# Patient Record
Sex: Female | Born: 1950 | Race: White | Hispanic: No | Marital: Married | Smoking: Former smoker
Health system: Southern US, Community
[De-identification: ages and names within clinical notes are randomized; demographics above are authoritative.]

## PROBLEM LIST (undated history)

## (undated) DIAGNOSIS — Z8719 Personal history of other diseases of the digestive system: Secondary | ICD-10-CM

## (undated) DIAGNOSIS — R296 Repeated falls: Secondary | ICD-10-CM

## (undated) DIAGNOSIS — H409 Unspecified glaucoma: Secondary | ICD-10-CM

## (undated) DIAGNOSIS — G2581 Restless legs syndrome: Secondary | ICD-10-CM

## (undated) DIAGNOSIS — M545 Low back pain, unspecified: Secondary | ICD-10-CM

## (undated) DIAGNOSIS — W19XXXA Unspecified fall, initial encounter: Secondary | ICD-10-CM

## (undated) DIAGNOSIS — K589 Irritable bowel syndrome without diarrhea: Secondary | ICD-10-CM

## (undated) HISTORY — DX: Repeated falls: R29.6

## (undated) HISTORY — DX: Restless legs syndrome: G25.81

## (undated) HISTORY — PX: BREAST EXCISIONAL BIOPSY: SUR124

## (undated) HISTORY — PX: COLONOSCOPY W/ POLYPECTOMY: SHX1380

## (undated) HISTORY — DX: Unspecified glaucoma: H40.9

## (undated) HISTORY — PX: HAMMER TOE SURGERY: SHX385

## (undated) HISTORY — DX: Unspecified fall, initial encounter: W19.XXXA

## (undated) HISTORY — DX: Low back pain, unspecified: M54.50

## (undated) HISTORY — PX: BREAST SURGERY: SHX581

---

## 2012-11-29 ENCOUNTER — Emergency Department (HOSPITAL_COMMUNITY)
Admission: EM | Admit: 2012-11-29 | Discharge: 2012-11-30 | Disposition: A | Discharge status: Home / Self Care | Payer: Managed Care, Other (non HMO) | Attending: Emergency Medicine | Admitting: Emergency Medicine

## 2012-11-29 ENCOUNTER — Encounter (HOSPITAL_COMMUNITY): Payer: Self-pay | Admitting: *Deleted

## 2012-11-29 ENCOUNTER — Emergency Department (HOSPITAL_COMMUNITY): Payer: Managed Care, Other (non HMO)

## 2012-11-29 DIAGNOSIS — Y9301 Activity, walking, marching and hiking: Secondary | ICD-10-CM | POA: Insufficient documentation

## 2012-11-29 DIAGNOSIS — W010XXA Fall on same level from slipping, tripping and stumbling without subsequent striking against object, initial encounter: Secondary | ICD-10-CM | POA: Insufficient documentation

## 2012-11-29 DIAGNOSIS — Z880 Allergy status to penicillin: Secondary | ICD-10-CM | POA: Insufficient documentation

## 2012-11-29 DIAGNOSIS — Y929 Unspecified place or not applicable: Secondary | ICD-10-CM | POA: Insufficient documentation

## 2012-11-29 DIAGNOSIS — S42309A Unspecified fracture of shaft of humerus, unspecified arm, initial encounter for closed fracture: Secondary | ICD-10-CM | POA: Insufficient documentation

## 2012-11-29 DIAGNOSIS — W1809XA Striking against other object with subsequent fall, initial encounter: Secondary | ICD-10-CM | POA: Insufficient documentation

## 2012-11-29 DIAGNOSIS — S42302A Unspecified fracture of shaft of humerus, left arm, initial encounter for closed fracture: Secondary | ICD-10-CM

## 2012-11-29 MED ORDER — MORPHINE SULFATE ER 15 MG PO TBCR: 15.0000 mg | EXTENDED_RELEASE_TABLET | Freq: Two times a day (BID) | ORAL | Status: DC

## 2012-11-29 MED ORDER — MORPHINE SULFATE 4 MG/ML IJ SOLN
1.0000 mg | Freq: Once | INTRAMUSCULAR | Status: AC
  Administered 2012-11-29: 1 mg via INTRAVENOUS
  Filled 2012-11-29: qty 1

## 2012-11-29 MED ORDER — CYCLOBENZAPRINE HCL 10 MG PO TABS: 10.0000 mg | ORAL_TABLET | Freq: Two times a day (BID) | ORAL | Status: DC | PRN

## 2012-11-29 MED ORDER — FENTANYL CITRATE 0.05 MG/ML IJ SOLN
50.0000 ug | Freq: Once | INTRAMUSCULAR | Status: AC
  Administered 2012-11-29: 50 ug via INTRAVENOUS
  Filled 2012-11-29: qty 2

## 2012-11-29 NOTE — ED Notes (Signed)
Pt states was celebrating selling her house in [place], drank 2 beers, slipped on a rug and fell on L arm. Pt states pain 4/10 at this time.

## 2012-11-29 NOTE — ED Notes (Signed)
Splint and sling placed on L arm by Ortho Tech, ambulated to restroom w/o difficulty.

## 2012-11-29 NOTE — ED Notes (Signed)
Patient transported to X-ray 

## 2012-11-29 NOTE — ED Notes (Signed)
ZOX:[id number]<AV> Expected date:11/29/12<BR> Expected time: 8:28 PM<BR> Means of arrival:Ambulance<BR> Comments:<BR> Arm injury

## 2012-11-29 NOTE — ED Provider Notes (Signed)
History     CSN: [id number]  Arrival date & time 11/29/12  2038   First MD Initiated Contact with Patient 11/29/12 2049      Chief Complaint  Patient presents with  . Arm Injury    (Consider location/radiation/quality/duration/timing/severity/associated sxs/prior treatment) HPI  Patient is a 62 year old female presenting via EMS for left arm pain. Patient states she was walking and talking on the phone when she slipped and fell hitting her left arm. Denies any cracking or other adventitious sounds when she fell. The patient was unable to move her arm after the incident. She states she is able to move her wrist forearm and fingers. Pt states she did have 4-5 beers this evening while she and her husband were celebrating selling their house.  Denies any numbness or tingling or radiating pain down the left arm. Denies loss of consciousness or hitting her head.  History reviewed. No pertinent past medical history.  History reviewed. No pertinent past surgical history.  History reviewed. No pertinent family history.  History  Substance Use Topics  . Smoking status: Never Smoker   . Smokeless tobacco: Never Used  . Alcohol Use: Yes    OB History   Grav Para Term Preterm Abortions TAB SAB Ect Mult Living                  Review of Systems  Constitutional: Negative.   HENT: Negative.   Eyes: Negative.   Respiratory: Negative.   Cardiovascular: Negative.   Gastrointestinal: Negative.   Genitourinary: Negative.   Musculoskeletal:       Left arm pain  Skin: Negative.   Neurological: Negative.     Allergies  Codeine and Penicillins  Home Medications   Current Outpatient Rx  Name  Route  Sig  Dispense  Refill  . cyclobenzaprine (FLEXERIL) 10 MG tablet   Oral   Take 1 tablet (10 mg total) by mouth 2 (two) times daily as needed for muscle spasms.   20 tablet   0   . morphine (MS CONTIN) 15 MG 12 hr tablet   Oral   Take 1 tablet (15 mg total) by mouth 2 (two) times  daily.   20 tablet   0     BP 101/61  Pulse 77  Temp(Src) 98.1 F (36.7 C) (Oral)  Resp 16  SpO2 100%  Physical Exam  Constitutional: She is oriented to person, place, and time. She appears well-developed and well-nourished.  HENT:  Head: Normocephalic and atraumatic.  Eyes: EOM are normal. Pupils are equal, round, and reactive to light.  Cardiovascular: Normal rate, regular rhythm and normal heart sounds.   Pulses:      Radial pulses are 2+ on the right side, and 2+ on the left side.  Pulmonary/Chest: Effort normal and breath sounds normal.  Abdominal: Soft. Bowel sounds are normal.  Musculoskeletal:       Right upper arm: Normal.       Left upper arm: She exhibits tenderness, swelling and deformity. She exhibits no edema.       Left hand: Normal. She exhibits normal two-point discrimination and normal capillary refill. Normal sensation noted. Normal strength noted.  No open wound.  No neurovascular compromise. No wrist drop  Neurological: She is alert and oriented to person, place, and time.  Skin: Skin is warm and dry.  Psychiatric: She has a normal mood and affect.    ED Course  Procedures (including critical care time)  Patient able to ambulate in  the ED w/o difficulty.   Labs Reviewed - No data to display Dg Humerus Left  11/29/2012  *RADIOLOGY REPORT*  Clinical Data: Fall, arm pain.  LEFT HUMERUS - 2+ VIEW  Comparison: None  Findings: There is an oblique displaced and angulated fracture through the midshaft of the left humerus.  Overlapping fracture fragments.  No subluxation or dislocation in the left shoulder or elbow.  IMPRESSION: Displaced, angulated midshaft left humeral fracture.   Original Report Authenticated By: Charlett Nose, M.D.      1. Humerus fracture, left, closed, initial encounter       MDM  Pt presents after falling on left arm. Obvious deformity w/ swelling noted in left arm. No neurovascular compromise or wrist drop noted on PE. X-ray  confirmed left humeral fracture. Dr. Charlann Boxer was consulted and recommended splinting with sling and a follow up appointment with him in 24-48 hours. Pt agreeable to plan. Given pain medications and instructions for splint and sling. Patient d/w with Dr. Radford Pax, agrees with plan. Patient is stable at time of discharge          Jeannetta Ellis, PA-C 12/01/12 1315

## 2012-11-29 NOTE — ED Notes (Signed)
Per EMS pt was walking and talking on phone, slipped and fell on L arm, didn't hear or feel crack, when got up realized arm was not moving right, called EMS, brake is upper L arm, obvious deformity, denies pain, pt did have 4-5 beers this evening, L arm immobilized by EMS, 20G R hand, BP 110/76, HR 60.

## 2012-12-02 NOTE — ED Provider Notes (Signed)
Medical screening examination/treatment/procedure(s) were conducted as a shared visit with non-physician practitioner(s) and myself.  I personally evaluated the patient during the encounter   Nelia Shi, MD 12/02/12 2310

## 2012-12-15 ENCOUNTER — Encounter (HOSPITAL_COMMUNITY): Payer: Self-pay | Admitting: *Deleted

## 2012-12-15 ENCOUNTER — Encounter (HOSPITAL_COMMUNITY): Payer: Self-pay | Admitting: Pharmacy Technician

## 2012-12-16 ENCOUNTER — Observation Stay (HOSPITAL_COMMUNITY)
Admission: RE | Admit: 2012-12-16 | Discharge: 2012-12-18 | Disposition: A | Discharge status: Home / Self Care | Payer: Managed Care, Other (non HMO) | Source: Ambulatory Visit | Attending: Orthopedic Surgery | Admitting: Orthopedic Surgery

## 2012-12-16 ENCOUNTER — Ambulatory Visit (HOSPITAL_COMMUNITY): Payer: Managed Care, Other (non HMO) | Admitting: Anesthesiology

## 2012-12-16 ENCOUNTER — Encounter (HOSPITAL_COMMUNITY)
Admission: RE | Disposition: A | Discharge status: Home / Self Care | Payer: Self-pay | Source: Ambulatory Visit | Attending: Orthopedic Surgery

## 2012-12-16 ENCOUNTER — Encounter (HOSPITAL_COMMUNITY): Payer: Self-pay | Admitting: *Deleted

## 2012-12-16 ENCOUNTER — Ambulatory Visit (HOSPITAL_COMMUNITY): Payer: Managed Care, Other (non HMO)

## 2012-12-16 ENCOUNTER — Encounter (HOSPITAL_COMMUNITY): Payer: Self-pay | Admitting: Anesthesiology

## 2012-12-16 DIAGNOSIS — W19XXXA Unspecified fall, initial encounter: Secondary | ICD-10-CM | POA: Insufficient documentation

## 2012-12-16 DIAGNOSIS — K589 Irritable bowel syndrome without diarrhea: Secondary | ICD-10-CM | POA: Insufficient documentation

## 2012-12-16 DIAGNOSIS — S42309A Unspecified fracture of shaft of humerus, unspecified arm, initial encounter for closed fracture: Principal | ICD-10-CM | POA: Insufficient documentation

## 2012-12-16 DIAGNOSIS — Z79899 Other long term (current) drug therapy: Secondary | ICD-10-CM | POA: Insufficient documentation

## 2012-12-16 HISTORY — DX: Personal history of other diseases of the digestive system: Z87.19

## 2012-12-16 HISTORY — PX: ORIF HUMERUS FRACTURE: SHX2126

## 2012-12-16 HISTORY — DX: Irritable bowel syndrome, unspecified: K58.9

## 2012-12-16 LAB — SURGICAL PCR SCREEN
MRSA, PCR: NEGATIVE
Staphylococcus aureus: NEGATIVE

## 2012-12-16 LAB — CBC
MCHC: 36.2 g/dL — ABNORMAL HIGH (ref 30.0–36.0)
RDW: 11.5 % (ref 11.5–15.5)
WBC: 5.3 10*3/uL (ref 4.0–10.5)

## 2012-12-16 SURGERY — OPEN REDUCTION INTERNAL FIXATION (ORIF) HUMERAL SHAFT FRACTURE
Anesthesia: General | Laterality: Left | Wound class: Clean

## 2012-12-16 MED ORDER — MUPIROCIN CALCIUM 2 % EX CREA
TOPICAL_CREAM | Freq: Two times a day (BID) | CUTANEOUS | Status: DC
Start: 2012-12-16 — End: 2012-12-16

## 2012-12-16 MED ORDER — METHOCARBAMOL 100 MG/ML IJ SOLN
500.0000 mg | Freq: Four times a day (QID) | INTRAVENOUS | Status: DC | PRN
  Filled 2012-12-16: qty 5

## 2012-12-16 MED ORDER — PROPOFOL 10 MG/ML IV BOLUS
INTRAVENOUS | Status: DC | PRN
  Administered 2012-12-16: 120 mg via INTRAVENOUS

## 2012-12-16 MED ORDER — ADULT MULTIVITAMIN W/MINERALS CH
1.0000 | ORAL_TABLET | Freq: Every day | ORAL | Status: DC
  Administered 2012-12-16 – 2012-12-17 (×2): 1 via ORAL
  Filled 2012-12-16 (×3): qty 1

## 2012-12-16 MED ORDER — METHOCARBAMOL 500 MG PO TABS
500.0000 mg | ORAL_TABLET | Freq: Four times a day (QID) | ORAL | Status: DC | PRN
  Administered 2012-12-17 – 2012-12-18 (×3): 500 mg via ORAL
  Filled 2012-12-16 (×3): qty 1

## 2012-12-16 MED ORDER — MUPIROCIN 2 % EX OINT
TOPICAL_OINTMENT | Freq: Two times a day (BID) | CUTANEOUS | Status: DC
  Administered 2012-12-16 – 2012-12-17 (×4): via NASAL
  Filled 2012-12-16: qty 22

## 2012-12-16 MED ORDER — GLYCOPYRROLATE 0.2 MG/ML IJ SOLN
INTRAMUSCULAR | Status: DC | PRN
  Administered 2012-12-16: 0.4 mg via INTRAVENOUS

## 2012-12-16 MED ORDER — OXYCODONE HCL 5 MG PO TABS: 5.0000 mg | ORAL_TABLET | Freq: Once | ORAL | Status: DC | PRN

## 2012-12-16 MED ORDER — VANCOMYCIN HCL 10 G IV SOLR
1500.0000 mg | INTRAVENOUS | Status: DC
  Filled 2012-12-16: qty 1500

## 2012-12-16 MED ORDER — CHLORHEXIDINE GLUCONATE 4 % EX LIQD: 60.0000 mL | Freq: Once | CUTANEOUS | Status: DC

## 2012-12-16 MED ORDER — HYDROMORPHONE HCL PF 1 MG/ML IJ SOLN: 0.2500 mg | INTRAMUSCULAR | Status: DC | PRN

## 2012-12-16 MED ORDER — PHENYLEPHRINE HCL 10 MG/ML IJ SOLN
INTRAMUSCULAR | Status: DC | PRN
  Administered 2012-12-16 (×3): 80 ug via INTRAVENOUS
  Administered 2012-12-16: 40 ug via INTRAVENOUS
  Administered 2012-12-16 (×3): 80 ug via INTRAVENOUS
  Administered 2012-12-16: 40 ug via INTRAVENOUS
  Administered 2012-12-16: 80 ug via INTRAVENOUS

## 2012-12-16 MED ORDER — VANCOMYCIN HCL IN DEXTROSE 1-5 GM/200ML-% IV SOLN
INTRAVENOUS | Status: AC
  Administered 2012-12-16: 1000 mg via INTRAVENOUS
  Filled 2012-12-16: qty 200

## 2012-12-16 MED ORDER — ONDANSETRON HCL 4 MG/2ML IJ SOLN
INTRAMUSCULAR | Status: DC | PRN
  Administered 2012-12-16: 4 mg via INTRAVENOUS

## 2012-12-16 MED ORDER — HYDROCODONE-ACETAMINOPHEN 7.5-325 MG PO TABS
1.0000 | ORAL_TABLET | ORAL | Status: DC | PRN
  Administered 2012-12-17 – 2012-12-18 (×2): 2 via ORAL

## 2012-12-16 MED ORDER — ACETAMINOPHEN 10 MG/ML IV SOLN: 1000.0000 mg | Freq: Once | INTRAVENOUS | Status: DC

## 2012-12-16 MED ORDER — LIDOCAINE HCL (CARDIAC) 20 MG/ML IV SOLN
INTRAVENOUS | Status: DC | PRN
  Administered 2012-12-16: 30 mg via INTRAVENOUS

## 2012-12-16 MED ORDER — NEOSTIGMINE METHYLSULFATE 1 MG/ML IJ SOLN
INTRAMUSCULAR | Status: DC | PRN
  Administered 2012-12-16: 3 mg via INTRAVENOUS

## 2012-12-16 MED ORDER — ACETAMINOPHEN 10 MG/ML IV SOLN
INTRAVENOUS | Status: AC
  Administered 2012-12-16: 1000 mg via INTRAVENOUS
  Filled 2012-12-16: qty 100

## 2012-12-16 MED ORDER — ALBUMIN HUMAN 5 % IV SOLN
INTRAVENOUS | Status: DC | PRN
  Administered 2012-12-16: 18:00:00 via INTRAVENOUS

## 2012-12-16 MED ORDER — PROMETHAZINE HCL 25 MG/ML IJ SOLN: 6.2500 mg | INTRAMUSCULAR | Status: DC | PRN

## 2012-12-16 MED ORDER — BUPIVACAINE-EPINEPHRINE PF 0.5-1:200000 % IJ SOLN
INTRAMUSCULAR | Status: DC | PRN
  Administered 2012-12-16: 25 mL

## 2012-12-16 MED ORDER — MUPIROCIN 2 % EX OINT
TOPICAL_OINTMENT | CUTANEOUS | Status: AC
  Administered 2012-12-16: 22:00:00
  Filled 2012-12-16: qty 22

## 2012-12-16 MED ORDER — DIPHENHYDRAMINE HCL 25 MG PO CAPS
25.0000 mg | ORAL_CAPSULE | Freq: Four times a day (QID) | ORAL | Status: DC | PRN
Start: 2012-12-16 — End: 2012-12-18

## 2012-12-16 MED ORDER — OXYCODONE-ACETAMINOPHEN 5-325 MG PO TABS
1.0000 | ORAL_TABLET | ORAL | Status: DC | PRN
  Administered 2012-12-17 – 2012-12-18 (×4): 2 via ORAL
  Filled 2012-12-16 (×6): qty 2

## 2012-12-16 MED ORDER — PHENYLEPHRINE HCL 10 MG/ML IJ SOLN
20.0000 mg | INTRAVENOUS | Status: DC | PRN
  Administered 2012-12-16: 50 ug/min via INTRAVENOUS

## 2012-12-16 MED ORDER — ONDANSETRON HCL 4 MG PO TABS: 4.0000 mg | ORAL_TABLET | Freq: Four times a day (QID) | ORAL | Status: DC | PRN

## 2012-12-16 MED ORDER — LACTATED RINGERS IV SOLN
INTRAVENOUS | Status: DC
  Administered 2012-12-16 (×3): via INTRAVENOUS

## 2012-12-16 MED ORDER — MIDAZOLAM HCL 2 MG/2ML IJ SOLN: 0.5000 mg | Freq: Once | INTRAMUSCULAR | Status: DC | PRN

## 2012-12-16 MED ORDER — CYCLOBENZAPRINE HCL 5 MG PO TABS
5.0000 mg | ORAL_TABLET | Freq: Every day | ORAL | Status: DC | PRN
  Administered 2012-12-17: 10 mg via ORAL
  Filled 2012-12-16: qty 2

## 2012-12-16 MED ORDER — DEXAMETHASONE SODIUM PHOSPHATE 4 MG/ML IJ SOLN
INTRAMUSCULAR | Status: DC | PRN
  Administered 2012-12-16: 4 mg via INTRAVENOUS

## 2012-12-16 MED ORDER — OXYCODONE HCL 5 MG/5ML PO SOLN: 5.0000 mg | Freq: Once | ORAL | Status: DC | PRN

## 2012-12-16 MED ORDER — ROCURONIUM BROMIDE 100 MG/10ML IV SOLN
INTRAVENOUS | Status: DC | PRN
  Administered 2012-12-16: 10 mg via INTRAVENOUS
  Administered 2012-12-16: 40 mg via INTRAVENOUS

## 2012-12-16 MED ORDER — MIDAZOLAM HCL 5 MG/5ML IJ SOLN
INTRAMUSCULAR | Status: DC | PRN
  Administered 2012-12-16: 2 mg via INTRAVENOUS

## 2012-12-16 MED ORDER — HYDROCODONE-ACETAMINOPHEN 7.5-325 MG PO TABS
1.0000 | ORAL_TABLET | Freq: Four times a day (QID) | ORAL | Status: DC | PRN
  Administered 2012-12-17: 2 via ORAL
  Filled 2012-12-16 (×3): qty 2

## 2012-12-16 MED ORDER — KCL IN DEXTROSE-NACL 20-5-0.45 MEQ/L-%-% IV SOLN
INTRAVENOUS | Status: DC
  Administered 2012-12-16 – 2012-12-17 (×2): via INTRAVENOUS
  Filled 2012-12-16 (×4): qty 1000

## 2012-12-16 MED ORDER — DOCUSATE SODIUM 100 MG PO CAPS
100.0000 mg | ORAL_CAPSULE | Freq: Two times a day (BID) | ORAL | Status: DC
  Administered 2012-12-17 (×2): 100 mg via ORAL
  Filled 2012-12-16 (×2): qty 1

## 2012-12-16 MED ORDER — VITAMIN C 500 MG PO TABS
1000.0000 mg | ORAL_TABLET | Freq: Every day | ORAL | Status: DC
  Administered 2012-12-16 – 2012-12-17 (×2): 1000 mg via ORAL
  Filled 2012-12-16: qty 1
  Filled 2012-12-16 (×2): qty 2
  Filled 2012-12-16: qty 1

## 2012-12-16 MED ORDER — VANCOMYCIN HCL IN DEXTROSE 1-5 GM/200ML-% IV SOLN
1000.0000 mg | INTRAVENOUS | Status: AC
  Administered 2012-12-16: 1000 mg via INTRAVENOUS

## 2012-12-16 MED ORDER — FENTANYL CITRATE 0.05 MG/ML IJ SOLN
INTRAMUSCULAR | Status: DC | PRN
  Administered 2012-12-16: 150 ug via INTRAVENOUS
  Administered 2012-12-16 (×2): 50 ug via INTRAVENOUS

## 2012-12-16 MED ORDER — MEPERIDINE HCL 25 MG/ML IJ SOLN: 6.2500 mg | INTRAMUSCULAR | Status: DC | PRN

## 2012-12-16 MED ORDER — ONDANSETRON HCL 4 MG/2ML IJ SOLN
4.0000 mg | Freq: Four times a day (QID) | INTRAMUSCULAR | Status: DC | PRN
  Administered 2012-12-17: 4 mg via INTRAVENOUS
  Filled 2012-12-16: qty 2

## 2012-12-16 SURGICAL SUPPLY — 59 items
11 hole proximal humerus plate ×2 IMPLANT
BANDAGE ELASTIC 3 VELCRO ST LF (GAUZE/BANDAGES/DRESSINGS) IMPLANT
BANDAGE ELASTIC 4 VELCRO ST LF (GAUZE/BANDAGES/DRESSINGS) ×2 IMPLANT
BANDAGE GAUZE ELAST BULKY 4 IN (GAUZE/BANDAGES/DRESSINGS) IMPLANT
BIT DRILL 2.8X4 QC CORT (BIT) ×2 IMPLANT
BIT DRILL 4 LONG FAST STEP (BIT) ×2 IMPLANT
BIT DRILL 4 SHORT (TRAUMA) ×2 IMPLANT
BNDG COHESIVE 4X5 WHT NS (GAUZE/BANDAGES/DRESSINGS) ×2 IMPLANT
BNDG ESMARK 4X9 LF (GAUZE/BANDAGES/DRESSINGS) IMPLANT
CATH URET WHISTLE 8FR 331008 (CATHETERS) ×2 IMPLANT
CLOTH BEACON ORANGE TIMEOUT ST (SAFETY) ×2 IMPLANT
CORDS BIPOLAR (ELECTRODE) ×2 IMPLANT
COVER MAYO STAND STRL (DRAPES) ×2 IMPLANT
COVER SURGICAL LIGHT HANDLE (MISCELLANEOUS) ×4 IMPLANT
CUFF TOURNIQUET SINGLE 18IN (TOURNIQUET CUFF) IMPLANT
CUFF TOURNIQUET SINGLE 24IN (TOURNIQUET CUFF) IMPLANT
DRAPE INCISE IOBAN 66X45 STRL (DRAPES) IMPLANT
DRAPE OEC MINIVIEW 54X84 (DRAPES) IMPLANT
DRAPE PROXIMA HALF (DRAPES) ×2 IMPLANT
DRSG ADAPTIC 3X8 NADH LF (GAUZE/BANDAGES/DRESSINGS) IMPLANT
GLOVE BIOGEL PI IND STRL 8.5 (GLOVE) ×3 IMPLANT
GLOVE BIOGEL PI INDICATOR 8.5 (GLOVE) ×3
GLOVE SURG ORTHO 8.0 STRL STRW (GLOVE) ×6 IMPLANT
GOWN PREVENTION PLUS XLARGE (GOWN DISPOSABLE) ×4 IMPLANT
GOWN STRL NON-REIN LRG LVL3 (GOWN DISPOSABLE) ×6 IMPLANT
KIT BASIN OR (CUSTOM PROCEDURE TRAY) ×2 IMPLANT
KIT ROOM TURNOVER OR (KITS) ×2 IMPLANT
LOOP VESSEL MAXI BLUE (MISCELLANEOUS) IMPLANT
MANIFOLD NEPTUNE II (INSTRUMENTS) IMPLANT
NEEDLE HYPO 25GX1X1/2 BEV (NEEDLE) IMPLANT
NS IRRIG 1000ML POUR BTL (IV SOLUTION) ×2 IMPLANT
PACK ORTHO EXTREMITY (CUSTOM PROCEDURE TRAY) ×4 IMPLANT
PAD ARMBOARD 7.5X6 YLW CONV (MISCELLANEOUS) ×4 IMPLANT
PAD CAST 4YDX4 CTTN HI CHSV (CAST SUPPLIES) IMPLANT
PADDING CAST COTTON 4X4 STRL (CAST SUPPLIES)
PEG STND 4.0X20.0MM (Orthopedic Implant) ×4 IMPLANT
PEG STND 4.0X25.0MM (Orthopedic Implant) ×2 IMPLANT
PEGSTD 4.0X20.0MM (Orthopedic Implant) ×2 IMPLANT
SCREW LOCK 90D ANGLED 3.8X24 (Screw) ×10 IMPLANT
SCREW MULTIDIR 3.8X24 HUMRL (Screw) ×2 IMPLANT
SCREW NON LOCKING SHAFT 22MM (Screw) ×2 IMPLANT
SLING ARM IMMOBILIZER MED (SOFTGOODS) ×2 IMPLANT
SOAP 2 % CHG 4 OZ (WOUND CARE) ×2 IMPLANT
SPONGE GAUZE 4X4 12PLY (GAUZE/BANDAGES/DRESSINGS) ×2 IMPLANT
STAPLER VISISTAT 35W (STAPLE) ×2 IMPLANT
STRIP CLOSURE SKIN 1/2X4 (GAUZE/BANDAGES/DRESSINGS) ×2 IMPLANT
SUT MERSILENE 4 0 P 3 (SUTURE) IMPLANT
SUT PROLENE 4 0 PS 2 18 (SUTURE) ×2 IMPLANT
SUT VIC AB 0 CT1 27 (SUTURE) ×2
SUT VIC AB 0 CT1 27XBRD ANBCTR (SUTURE) ×2 IMPLANT
SUT VIC AB 2-0 CT1 27 (SUTURE) ×4
SUT VIC AB 2-0 CT1 TAPERPNT 27 (SUTURE) ×4 IMPLANT
SYR CONTROL 10ML LL (SYRINGE) IMPLANT
TOWEL OR 17X24 6PK STRL BLUE (TOWEL DISPOSABLE) ×2 IMPLANT
TOWEL OR 17X26 10 PK STRL BLUE (TOWEL DISPOSABLE) ×4 IMPLANT
TUBE CONNECTING 12X1/4 (SUCTIONS) IMPLANT
UNDERPAD 30X30 INCONTINENT (UNDERPADS AND DIAPERS) IMPLANT
WATER STERILE IRR 1000ML POUR (IV SOLUTION) ×2 IMPLANT
YANKAUER SUCT BULB TIP NO VENT (SUCTIONS) ×2 IMPLANT

## 2012-12-16 NOTE — Brief Op Note (Signed)
12/16/2012  3:01 PM  PATIENT:  Janet Houston  62 y.o. female  PRE-OPERATIVE DIAGNOSIS:  Left Humeral Shaft Fracture  POST-OPERATIVE DIAGNOSIS:  left humeral shaft fracture   PROCEDURE:  Procedure(s): OPEN REDUCTION INTERNAL FIXATION (ORIF) LEFT HUMERAL SHAFT FRACTURE (Left)  SURGEON:  Surgeon(s) and Role:    * Sharma Covert, MD - Primary  PHYSICIAN ASSISTANT: None  ASSISTANTS: none   ANESTHESIA:   general  EBL:     BLOOD ADMINISTERED:none  DRAINS: none   LOCAL MEDICATIONS USED:  MARCAINE     SPECIMEN:  No Specimen  DISPOSITION OF SPECIMEN:  N/A  COUNTS:  YES  TOURNIQUET:    DICTATION: .[id number]  PLAN OF CARE: Admit for overnight observation  PATIENT DISPOSITION:  PACU - hemodynamically stable.   Delay start of Pharmacological VTE agent (>24hrs) due to surgical blood loss or risk of bleeding: not applicable

## 2012-12-16 NOTE — H&P (Signed)
Janet Houston is an 62 y.o. female.   Chief Complaint: LEFT HUMERAL SHAFT FRACTURE HPI: PT WITH FALL AND INJURED LEFT HUMERAL SHAFT PT WAS TREATED CONSERVATIVELY WITH A FRACTURE BRACE BUT DISPLACEMENT REMAINED PT HERE FOR SURGERY TO ALIGN MALIGNED FRACTURE PT SEEN AND EVALUATED IN OFFICE  Past Medical History  Diagnosis Date  . IBS (irritable bowel syndrome)   . H/O hiatal hernia     Past Surgical History  Procedure Laterality Date  . Breast surgery Right     Lumpectomy  . Colonoscopy w/ polypectomy    . Hammer toe surgery      5th toe, bone removed    History reviewed. No pertinent family history. Social History:  reports that she has quit smoking. She has never used smokeless tobacco. She reports that she does not drink alcohol or use illicit drugs.  Allergies:  Allergies  Allergen Reactions  . Bee Venom Anaphylaxis  . Codeine Anaphylaxis  . Penicillins Anaphylaxis    Medications Prior to Admission  Medication Sig Dispense Refill  . cyclobenzaprine (FLEXERIL) 10 MG tablet Take 5-10 mg by mouth daily as needed for muscle spasms.      Marland Kitchen estradiol (ESTRACE) 0.5 MG tablet Take 1.5 mg by mouth daily.       Marland Kitchen HYDROcodone-acetaminophen (NORCO) 7.5-325 MG per tablet Take 1-2 tablets by mouth every 6 (six) hours as needed for pain.      . medroxyPROGESTERone (PROVERA) 5 MG tablet Take 5 mg by mouth daily.      . Multiple Vitamin (MULTIVITAMIN WITH MINERALS) TABS Take 1 tablet by mouth daily.        Results for orders placed during the hospital encounter of 12/16/12 (from the past 48 hour(s))  CBC     Status: Abnormal   Collection Time    12/16/12 12:41 PM      Result Value Range   WBC 5.3  4.0 - 10.5 K/uL   RBC 3.57 (*) 3.87 - 5.11 MIL/uL   Hemoglobin 12.3  12.0 - 15.0 g/dL   HCT 57.8 (*) 46.9 - 62.9 %   MCV 95.2  78.0 - 100.0 fL   MCH 34.5 (*) 26.0 - 34.0 pg   MCHC 36.2 (*) 30.0 - 36.0 g/dL   RDW 52.8  41.3 - 24.4 %   Platelets 331  150 - 400 K/uL   No results  found.  NO RECENT ILLNESSES OR HOSPITALIZATIONS  Blood pressure 106/71, pulse 75, temperature 97.6 F (36.4 C), temperature source Oral, resp. rate 16, height 5\' 3"  (1.6 m), weight 49.9 kg (110 lb 0.2 oz), SpO2 100.00%. General Appearance:  Alert, cooperative, no distress, appears stated age  Head:  Normocephalic, without obvious abnormality, atraumatic  Eyes:  Pupils equal, conjunctiva/corneas clear,         Throat: Lips, mucosa, and tongue normal; teeth and gums normal  Neck: No visible masses     Lungs:   respirations unlabored  Chest Wall:  No tenderness or deformity  Heart:  Regular rate and rhythm,  Abdomen:   Soft, non-tender,         Extremities: LEFT ARM: SKIN INTACT, FINGERS WARM WELL PERFUSED GOOD CAP REFIL. ABLE TO FLEX AND EXTEND WRIST. ABLE TO CROSS FINGERS +DEFORMITY TO ARM LIMITED ELBOW AND FOREARM MOBILITY  Pulses: 2+ and symmetric  Skin: Skin color, texture, turgor normal, no rashes or lesions     Neurologic: Normal    Assessment/Plan LEFT HUMERAL SHAFT FRACTURE DISPLACED,COMMINUTED  LEFT HUMERAL SHAFT OPEN REDUCTION  AND INTERNAL FIXATION  R/B/A DISCUSSED WITH PT IN OFFICE.  PT VOICED UNDERSTANDING OF PLAN CONSENT SIGNED DAY OF SURGERY PT SEEN AND EXAMINED PRIOR TO OPERATIVE PROCEDURE/DAY OF SURGERY SITE MARKED. QUESTIONS ANSWERED WILL REMAIN OVERNIGHT FOLLOWING SURGERY  Sharma Covert 12/16/2012, 1:46 PM

## 2012-12-16 NOTE — Anesthesia Procedure Notes (Addendum)
Anesthesia Regional Block:  Interscalene brachial plexus block  Pre-Anesthetic Checklist: ,, timeout performed, Correct Patient, Correct Site, Correct Laterality, Correct Procedure, Correct Position, site marked, Risks and benefits discussed,  Surgical consent,  Pre-op evaluation,  At surgeon's request and post-op pain management  Laterality: Left  Prep: chloraprep       Needles:  Injection technique: Single-shot  Needle Type: Stimulator Needle - 40     Needle Length: 4cm  Needle Gauge: 22 and 22 G    Additional Needles:  Procedures: nerve stimulator Interscalene brachial plexus block  Nerve Stimulator or Paresthesia:  Response: forearm twitch, 0.45 mA, 0.1 ms,   Additional Responses:   Narrative:  Start time: 12/16/2012 2:57 PM End time: 12/16/2012 3:01 PM Injection made incrementally with aspirations every 5 mL.  Performed by: Personally  Anesthesiologist: Sandford Craze, MD  Additional Notes: Pt identified in Holding room.  Monitors applied. Working IV access confirmed. Sterile prep L neck.  #22ga PNS to forearm twitch at 0.69mA threshold.  25cc 0.5% Bupivacaine with 1:200k epi injected incrementally after negative test dose.  Patient asymptomatic, VSS, no heme aspirated, tolerated well.     Procedure Name: Intubation Date/Time: 12/16/2012 3:18 PM Performed by: Leona Singleton A Pre-anesthesia Checklist: Patient identified Patient Re-evaluated:Patient Re-evaluated prior to inductionOxygen Delivery Method: Circle system utilized Preoxygenation: Pre-oxygenation with 100% oxygen Intubation Type: IV induction Ventilation: Mask ventilation without difficulty Laryngoscope Size: Miller and 2 Grade View: Grade I Tube type: Oral Tube size: 7.5 mm Number of attempts: 1 Airway Equipment and Method: Stylet and LTA kit utilized Placement Confirmation: ETT inserted through vocal cords under direct vision,  positive ETCO2 and breath sounds checked- equal and bilateral Secured at: 20  cm Tube secured with: Tape Dental Injury: Teeth and Oropharynx as per pre-operative assessment

## 2012-12-16 NOTE — Progress Notes (Signed)
ANTIBIOTIC CONSULT NOTE - INITIAL  Pharmacy Consult for Vancomycin  Indication: post op orthopedic hand surgery  Allergies  Allergen Reactions  . Bee Venom Anaphylaxis  . Codeine Anaphylaxis  . Penicillins Anaphylaxis    Patient Measurements: Height: 5\' 3"  (160 cm) Weight: 128 lb 15.5 oz (58.5 kg) IBW/kg (Calculated) : 52.4   Vital Signs: Temp: 98.9 F (37.2 C) (05/21 2034) Temp src: Oral (05/21 2034) BP: 101/62 mmHg (05/21 2034) Pulse Rate: 90 (05/21 2034) Intake/Output from previous day:   Intake/Output from this shift:    Labs:  Recent Labs  12/16/12 1241  WBC 5.3  HGB 12.3  PLT 331   CrCl is unknown because no creatinine reading has been taken. No results found for this basename: VANCOTROUGH, Leodis Binet, VANCORANDOM, GENTTROUGH, GENTPEAK, GENTRANDOM, TOBRATROUGH, TOBRAPEAK, TOBRARND, AMIKACINPEAK, AMIKACINTROU, AMIKACIN,  in the last 72 hours   Microbiology: Recent Results (from the past 720 hour(s))  SURGICAL PCR SCREEN     Status: None   Collection Time    12/16/12  1:38 PM      Result Value Range Status   MRSA, PCR NEGATIVE  NEGATIVE Final   Staphylococcus aureus NEGATIVE  NEGATIVE Final   Comment:            The Xpert SA Assay (FDA     approved for NASAL specimens     in patients over 75 years of age),     is one component of     a comprehensive surveillance     program.  Test performance has     been validated by [employer] for patients greater     than or equal to 67 year old.     It is not intended     to diagnose infection nor to     guide or monitor treatment.    Medical History: Past Medical History  Diagnosis Date  . IBS (irritable bowel syndrome)   . H/O hiatal hernia     Medications:  Prescriptions prior to admission  Medication Sig Dispense Refill  . cyclobenzaprine (FLEXERIL) 10 MG tablet Take 5-10 mg by mouth daily as needed for muscle spasms.      Marland Kitchen estradiol (ESTRACE) 0.5 MG tablet Take 1.5 mg by mouth daily.        Marland Kitchen HYDROcodone-acetaminophen (NORCO) 7.5-325 MG per tablet Take 1-2 tablets by mouth every 6 (six) hours as needed for pain.      . medroxyPROGESTERone (PROVERA) 5 MG tablet Take 5 mg by mouth daily.      . Multiple Vitamin (MULTIVITAMIN WITH MINERALS) TABS Take 1 tablet by mouth daily.       Scheduled:  . docusate sodium  100 mg Oral BID  . multivitamin with minerals  1 tablet Oral Daily  . mupirocin ointment   Nasal BID  . mupirocin ointment      . vitamin C  1,000 mg Oral Daily   Assessment: 62 y.o female s/p orthopedic hand surgery.  Afebrile, WBC within normal limits at 5.3K   EPIC and anesthesia reports indicate that patient received IV vancomycin 1g at 15:06 and 1g at 15:10 in the OR.   No baseline SCR available. No past history of renal insufficiency reported.   Goal of Therapy:  Vancomycin trough = 10-15 mcg/ml   Plan:  Vancomycin 1500mg  IV q24h to start tomorrow at 15:00 Baseline serum creatinine in AM to evaluate renal function and will adjust dose if needed.   Noah Delaine, RPh  Clinical Pharmacist Pager: [phone] 12/16/2012,8:52 PM

## 2012-12-16 NOTE — Transfer of Care (Signed)
Immediate Anesthesia Transfer of Care Note  Patient: Janet Houston  Procedure(s) Performed: Procedure(s): OPEN REDUCTION INTERNAL FIXATION (ORIF) LEFT HUMERAL SHAFT FRACTURE (Left)  Patient Location: PACU  Anesthesia Type:General and GA combined with regional for post-op pain  Level of Consciousness: awake, alert  and oriented  Airway & Oxygen Therapy: Patient Spontanous Breathing and Patient connected to nasal cannula oxygen  Post-op Assessment: Report given to PACU RN and Post -op Vital signs reviewed and stable  Post vital signs: Reviewed and stable  Complications: No apparent anesthesia complications

## 2012-12-16 NOTE — Anesthesia Preprocedure Evaluation (Addendum)
Anesthesia Evaluation  Patient identified by MRN, date of birth, ID band Patient awake    Reviewed: Allergy & Precautions, H&P , NPO status , Patient's Chart, lab work & pertinent test results  History of Anesthesia Complications Negative for: history of anesthetic complications  Airway Mallampati: I TM Distance: >3 FB Neck ROM: Full    Dental  (+) Teeth Intact and Dental Advisory Given   Pulmonary former smoker,  breath sounds clear to auscultation  Pulmonary exam normal       Cardiovascular negative cardio ROS  Rhythm:Regular Rate:Normal     Neuro/Psych negative neurological ROS  negative psych ROS   GI/Hepatic negative GI ROS, Neg liver ROS, hiatal hernia,   Endo/Other  negative endocrine ROS  Renal/GU negative Renal ROS     Musculoskeletal   Abdominal   Peds  Hematology   Anesthesia Other Findings   Reproductive/Obstetrics                           Anesthesia Physical Anesthesia Plan  ASA: I  Anesthesia Plan: General   Post-op Pain Management:    Induction: Intravenous  Airway Management Planned: Oral ETT  Additional Equipment:   Intra-op Plan:   Post-operative Plan: Extubation in OR  Informed Consent: I have reviewed the patients History and Physical, chart, labs and discussed the procedure including the risks, benefits and alternatives for the proposed anesthesia with the patient or authorized representative who has indicated his/her understanding and acceptance.   Dental advisory given  Plan Discussed with: CRNA and Surgeon  Anesthesia Plan Comments: (Plan routine monitors, GETA with interscalene block for post op analgesia  )        Anesthesia Quick Evaluation

## 2012-12-16 NOTE — Preoperative (Signed)
Beta Blockers   Reason not to administer Beta Blockers:Not Applicable 

## 2012-12-16 NOTE — Anesthesia Postprocedure Evaluation (Signed)
Anesthesia Post Note  Patient: Janet Houston  Procedure(s) Performed: Procedure(s) (LRB): OPEN REDUCTION INTERNAL FIXATION (ORIF) LEFT HUMERAL SHAFT FRACTURE (Left)  Anesthesia type: general  Patient location: PACU  Post pain: Pain level controlled  Post assessment: Patient's Cardiovascular Status Stable  Last Vitals:  Filed Vitals:   12/16/12 2034  BP: 101/62  Pulse: 90  Temp: 37.2 C  Resp: 18    Post vital signs: Reviewed and stable  Level of consciousness: sedated  Complications: No apparent anesthesia complications

## 2012-12-17 LAB — BASIC METABOLIC PANEL
BUN: 6 mg/dL (ref 6–23)
Calcium: 9.1 mg/dL (ref 8.4–10.5)
Creatinine, Ser: 0.56 mg/dL (ref 0.50–1.10)
GFR calc Af Amer: >90 mL/min (ref >90)
GFR calc non Af Amer: >90 mL/min (ref >90)
Glucose, Bld: 121 mg/dL — ABNORMAL HIGH (ref 70–99)

## 2012-12-17 MED ORDER — HYDROMORPHONE HCL 2 MG PO TABS: 2.0000 mg | ORAL_TABLET | ORAL | Status: DC | PRN

## 2012-12-17 MED ORDER — METHOCARBAMOL 500 MG PO TABS: 500.0000 mg | ORAL_TABLET | Freq: Four times a day (QID) | ORAL | Status: DC

## 2012-12-17 MED ORDER — VANCOMYCIN HCL 10 G IV SOLR
1500.0000 mg | INTRAVENOUS | Status: AC
  Administered 2012-12-17: 1500 mg via INTRAVENOUS
  Filled 2012-12-17: qty 1500

## 2012-12-17 MED ORDER — VITAMIN C 500 MG PO TABS: 500.0000 mg | ORAL_TABLET | Freq: Every day | ORAL | Status: DC

## 2012-12-17 MED ORDER — DOCUSATE SODIUM 100 MG PO CAPS: 100.0000 mg | ORAL_CAPSULE | Freq: Two times a day (BID) | ORAL | Status: DC

## 2012-12-17 MED ORDER — OXYCODONE-ACETAMINOPHEN 5-325 MG PO TABS: 1.0000 | ORAL_TABLET | ORAL | Status: DC | PRN

## 2012-12-17 NOTE — Progress Notes (Signed)
UR completed 

## 2012-12-17 NOTE — Evaluation (Signed)
Occupational Therapy Evaluation Patient Details Name: Janet Houston MRN: [id number] DOB: [birth year] Today's Date: 12/17/2012 Time: 0865-7846 OT Time Calculation (min): 55 min  OT Assessment / Plan / Recommendation Clinical Impression  This 62 yo female s/p fall on May 4th with resultant left humerus shaft fracture presents to acute OT now s/p ORIF to said humerus. Will benefit from continued OT/PT per Dr. Melvyn Novas for LUE rehab.    OT Assessment  Progress rehab of shoulder as ordered by MD at follow-up appointment;Patient needs continued OT Services    Follow Up Recommendations  No OT follow up    Barriers to Discharge None    Equipment Recommendations  None recommended by OT       Frequency  Min 2X/week    Precautions / Restrictions Precautions Precautions: Shoulder Required Braces or Orthoses: Other Brace/Splint Other Brace/Splint: Shoulder immobilizer sling Restrictions Weight Bearing Restrictions: Yes LUE Weight Bearing: Non weight bearing   Pertinent Vitals/Pain Right upper arm pain with elbow ROM (P/AA)    ADL  Eating/Feeding: Modified independent Where Assessed - Eating/Feeding: Bed level Grooming: Set up (due to nausea and dizziness when up on her feet) Where Assessed - Grooming: Unsupported sitting Upper Body Bathing: Minimal assistance Where Assessed - Upper Body Bathing: Unsupported sitting Lower Body Bathing: Min guard (due to nausea and dizziness when up on her feet) Where Assessed - Lower Body Bathing: Unsupported sitting Upper Body Dressing: Moderate assistance Where Assessed - Upper Body Dressing: Unsupported sitting Lower Body Dressing: Min guard (due to nausea and dizziness when up on her feet) Where Assessed - Lower Body Dressing: Unsupported sit to stand Toilet Transfer: Min guard (Due to nausea and dizziness when up) Toilet Transfer Method: Sit to stand Toileting - Clothing Manipulation and Hygiene: Min guard Where Assessed - Toileting Clothing  Manipulation and Hygiene: Standing Equipment Used:  (shoulder immobilizer) Transfers/Ambulation Related to ADLs: Mod I for sit to stand and stand to sit; min guard A for ambulation due to nausea and dizziness when up on her feet this PM    OT Diagnosis: Acute pain  OT Problem List: Decreased range of motion;Increased edema;Pain OT Treatment Interventions: Therapeutic exercise   OT Goals Acute Rehab OT Goals OT Goal Formulation: With patient Time For Goal Achievement: 12/24/12 Potential to Achieve Goals: Good Arm Goals Additional Arm Goal #1: Pt will be Independent with LUE exercises (AAROM left elbow, SROM left shoulder abduction, massage to LUE) Arm Goal: Additional Goal #1 - Progress: Goal set today  Visit Information  Last OT Received On: 12/17/12 Assistance Needed: +1    Subjective Data  Subjective: I was fine earlier, I walked around the unit (in response to when she got up with me both times she got nauseated and really hot) Patient Stated Goal: Go home today   Prior Functioning     Home Living Lives With: Spouse Available Help at Discharge: Family Type of Home: House Home Access: Stairs to enter Secretary/administrator of Steps: 3 Entrance Stairs-Rails: None Home Layout: One level Bathroom Shower/Tub: Engineer, manufacturing systems: Standard Home Adaptive Equipment: None Prior Function Level of Independence:  (Mod I since broke arm May 4th) Able to Take Stairs?: Yes Driving: Yes Vocation: Full time employment Comments: Research scientist (medical) at [employer]: No difficulties Dominant Hand: Right            Cognition  Cognition Arousal/Alertness: Awake/alert Behavior During Therapy: WFL for tasks assessed/performed Overall Cognitive Status: Within Functional Limits for tasks assessed    Extremity/Trunk  Assessment Right Upper Extremity Assessment RUE ROM/Strength/Tone: Within functional levels Left Upper Extremity Assessment LUE  ROM/Strength/Tone: Deficits LUE ROM/Strength/Tone Deficits: LUE in sling, grip 4/5, wrist flexion/extension WFL; Can get her elbow to 90 degrees flexion (not more due to pitting edema); elbow extension to -30 degrees due to pain and pitting edema; supination to neutral from pronation LUE Coordination: WFL - fine motor     Mobility Bed Mobility Bed Mobility: Supine to Sit;Sit to Supine Supine to Sit: 7: Independent;HOB elevated (20 degrees) Sit to Supine: 7: Independent;HOB flat Transfers Transfers: Sit to Stand;Stand to Sit Sit to Stand: 4: Min guard;With upper extremity assist;From bed Stand to Sit: 4: Min guard;Without upper extremity assist;To bed Details for Transfer Assistance: Min guard A this PM due to nausea and dizziness     Exercise Other Exercises Other Exercises: 10 reps of AAROM at left elbow (pt has been in a sling since May 4th when she broke her arm--thus she has pitting edema in her LUE (mid forearm and proximally); also massage to this edmatous part of her arm elbow and distally; also had her do gentle SROM for shoulder abduction to her tolerance.      End of Session OT - End of Session Activity Tolerance:  (limited by nausea; however all education was completed.) Patient left: in bed;with call bell/phone within reach Nurse Communication:  (Need to anti-nausea meds)       Evette Georges [phone] 12/17/2012, 3:27 PM

## 2012-12-17 NOTE — Progress Notes (Signed)
Subjective: Still with pain in arm. Not ready to go home   Objective: Vital signs in last 24 hours: Temp:  [98.5 F (36.9 C)-99 F (37.2 C)] 98.6 F (37 C) (05/22 1417) Pulse Rate:  [77-95] 86 (05/22 1417) Resp:  [12-21] 17 (05/22 1417) BP: (95-112)/(42-66) 96/59 mmHg (05/22 1417) SpO2:  [94 %-100 %] 100 % (05/22 1417) Weight:  [58.5 kg (128 lb 15.5 oz)] 58.5 kg (128 lb 15.5 oz) (05/21 2034)  Intake/Output from previous day: 05/21 0701 - 05/22 0700 In: 3055.8 [I.V.:2805.8; IV Piggyback:250] Out: 1950 [Urine:1650; Blood:300] Intake/Output this shift: Total I/O In: -  Out: 700 [Urine:700]   Recent Labs  12/16/12 1241  HGB 12.3    Recent Labs  12/16/12 1241  WBC 5.3  RBC 3.57*  HCT 34.0*  PLT 331    Recent Labs  12/17/12 0700  NA 133*  K 3.7  CL 100  CO2 19  BUN 6  CREATININE 0.56  GLUCOSE 121*  CALCIUM 9.1   No results found for this basename: LABPT, INR,  in the last 72 hours  Neurologically intact  Assessment/Plan: Left shoulder/humerus orif  Continue inpatient care iv abx and pain meds Pain level not ready to go home Will plan for d/c in am   Sharma Covert 12/17/2012, 5:50 PM

## 2012-12-17 NOTE — Evaluation (Signed)
Physical Therapy Evaluation Patient Details Name: Janet Houston MRN: [id number] DOB: 25-Feb-1951 Today's Date: 12/17/2012 Time: 1022-1100 PT Time Calculation (min): 38 min  PT Assessment / Plan / Recommendation Clinical Impression  62 y.o. female admitted for ORIF L humerus fx. Pt ambulated 400' without assistive device, no LOB. Pt is independent with mobility. Instructed pt in hand AROM/strengthening exercises and shoulder rolls. NO further acute PT needs. OK to DC home from PT standpoint.     PT Assessment  Patent does not need any further PT services    Follow Up Recommendations  No PT follow up    Does the patient have the potential to tolerate intense rehabilitation      Barriers to Discharge        Equipment Recommendations  None recommended by PT    Recommendations for Other Services OT consult   Frequency      Precautions / Restrictions Restrictions Weight Bearing Restrictions: Yes   Pertinent Vitals/Pain **3/10 LUE Premedicated, repositioned*      Mobility  Bed Mobility Bed Mobility: Supine to Sit Supine to Sit: 7: Independent;HOB flat Transfers Transfers: Sit to Stand;Stand to Sit Sit to Stand: 7: Independent Stand to Sit: 7: Independent Ambulation/Gait Ambulation/Gait Assistance: 7: Independent Ambulation Distance (Feet): 400 Feet Assistive device: None Ambulation/Gait Assistance Details: LUE in sling Gait Pattern: Within Functional Limits Gait velocity: WNL General Gait Details: no LOB    Exercises     PT Diagnosis:    PT Problem List:   PT Treatment Interventions:     PT Goals    Visit Information  Last PT Received On: 12/17/12 Assistance Needed: +1    Subjective Data  Subjective: I've had lots of falls, ever since I was a child. My equilibrium gets thrown off easily.  Patient Stated Goal: return to work at [employer], return to [employer] lifting at gym   Prior Functioning  Home Living Lives With: Spouse Available Help at Discharge:  Family (husband works during day) Type of Home: House Home Access: Stairs to enter Secretary/administrator of Steps: 3 Entrance Stairs-Rails: None Home Layout: One level Bathroom Shower/Tub: Network engineer: None Prior Function Level of Independence: Independent Able to Take Stairs?: Yes Driving: Yes Vocation: Full time employment Comments: Research scientist (medical) at [employer]: No difficulties    Cognition  Cognition Arousal/Alertness: Awake/alert Behavior During Therapy: WFL for tasks assessed/performed Overall Cognitive Status: Within Functional Limits for tasks assessed    Extremity/Trunk Assessment Right Upper Extremity Assessment RUE ROM/Strength/Tone: Within functional levels Left Upper Extremity Assessment LUE ROM/Strength/Tone: Deficits LUE ROM/Strength/Tone Deficits: LUE in sling, grip 4/5, wrist flexion/extension WFL LUE Sensation: WFL - Light Touch LUE Coordination: WFL - gross/fine motor Right Lower Extremity Assessment RLE ROM/Strength/Tone: Within functional levels RLE Sensation: WFL - Light Touch RLE Coordination: WFL - gross/fine motor Left Lower Extremity Assessment LLE ROM/Strength/Tone: Within functional levels LLE Sensation: WFL - Light Touch LLE Coordination: WFL - gross/fine motor Trunk Assessment Trunk Assessment: Normal   Balance    End of Session PT - End of Session Equipment Utilized During Treatment:  (LUE sling) Activity Tolerance: Patient tolerated treatment well Patient left: in chair;with call bell/phone within reach Nurse Communication: Mobility status  GP Functional Assessment Tool Used: clinical judgement Functional Limitation: Mobility: Walking and moving around Mobility: Walking and Moving Around Current Status [phone]): 0 percent impaired, limited or restricted Mobility: Walking and Moving Around Goal Status ([id number]): 0 percent impaired, limited or  restricted Mobility: Walking and Moving  Around Discharge Status [phone]): 0 percent impaired, limited or restricted   Tamala Ser 12/17/2012, 11:07 AM [phone]

## 2012-12-17 NOTE — Op Note (Signed)
[name], Houston NO.:  [id number]  MEDICAL RECORD NO.:  [id number]  LOCATION:  6N04C                        FACILITY:  [facility]  PHYSICIAN:  Madelynn Done, MD  DATE OF BIRTH:  [birth year]  DATE OF PROCEDURE:  12/16/2012 DATE OF DISCHARGE:                              OPERATIVE REPORT   PREOPERATIVE DIAGNOSIS:  Left displaced humeral shaft fracture.  POSTOPERATIVE DIAGNOSIS:  Left displaced humeral shaft fracture.  ATTENDING PHYSICIAN:  Sharma Covert IV, MD, who scrubbed and present for the entire procedure.  ASSISTANT SURGEON:  None.  ANESTHESIA:  General via endotracheal tube.  PROCEDURE: 1. Open treatment of displaced left humeral shaft fracture requiring     internal fixation. 2. Radiographs, 3 views, left shoulder and left humerus.  SURGICAL IMPLANT:  Biomet S3 plate with a combination of locking and nonlocking screws, 4-0 locking pegs proximally, 3.8 mm screws in the shaft and one 3.5 mm lag screw outside of the plate.  SURGICAL INDICATIONS:  Ms. Janet Houston is a right-hand dominant female, who sustained a displaced humeral shaft fracture after a fall.  The patient was seen and evaluated in the office and given the degree of displacement, it was recommended that she undergo the above procedure. Risks, benefits, and alternatives were discussed in detail with the patient and signed informed consent was obtained.  Risks include, but not limited to bleeding, infection, damage to nearby nerves, arteries, or tendons, loss of motion of wrist and digits, incomplete relief of symptoms, nonunion, malunion, damage to nearby nerves, loss of motion of the wrist, elbow, shoulder, and need for further surgical intervention.  DESCRIPTION OF PROCEDURE:  The patient was properly identified in the preop holding area and marked with a permanent marker made on the left humerus indicating correct operative site.  The patient was then brought back to operating room  and placed supine on the anesthesia room table. General anesthesia was administered.  The patient tolerated this well. Left upper extremity was sealed with 1000 drape.  The patient received preoperative antibiotics.  Left upper extremity was then prepped and draped in normal sterile fashion.  Time-out was called.  Correct site was identified, and procedure was then begun.  Using anterolateral approach to the humerus, coracoid process, and lateral epicondylar region __________ anatomical landmarks and skin incision was then made for an anterolateral approach.  The dissection was then carried down through the skin and subcutaneous tissues.  Cephalic vein was then carefully identified __________ deltopectoral interval was then obtained proximally.  The patient had markedly displaced fracture fragments. This was protruding through the deltoid region.  __________ elevate the brachialis distally, but there really was not much of an interval as the very large amount of fluid pockets where almost a near pseudoarthrosis was forming and the fracture hematoma was then carefully evacuated. Blunt dissection was carried all the way down the humeral shaft.  Sharp instruments were then used during the blunt exposure.  I was able to visualize the humeral shaft all the way proximally and distally with good visualization.  Once this was carried out, fracture hematoma and the fracture site were then evacuated.  Reduction clamp was then applied.  The  long S3 plate was then applied.  We looked between a combination of a 3.5 plate and 4.5 plate.  A 4.5 plate for her was too large.  The S3 plate provided a little bit of stouter of a plate, larger screw fixation, and also gave some locking fixation proximally up towards the hand.  The patient did have the long spiral fracture and then needed additional fixation proximally.  Once this was carried out, the plate was then applied and held temporarily in place with  K-wires.  The K-wires then confirmed adequate reduction in AP and lateral planes.  Once this was done, the screw fixation was then begun with 3.8 mm bicortical screws both proximally and distally, six cortices proximally and 6 cortices distally.  Once this was carried out, the 3.5 mm lag screw was then placed in the anterior to posterior direction spanning the fracture. There was good spanning of the plate in the lag screw fixation.  This was with 3.5 mm drill for the anterior cortex and 2.5 mm for the posterior cortex and spanned it very nicely.  Once this was carried out, and further fixation was then carried out proximally with __________ 3 locking pegs with 4.0 mm drill bit was then used, locking pegs of the respective lengths.  The final fixation was then carried out distally with a nonlocking screw with the 2.8 mm drill bit.  Copious wound irrigation done throughout.  The wound was then irrigated.  The fascia interval was then closed with 2-0 Vicryl suture, subcutaneous tissues closed with 4-0 Vicryl suture and the skin closed with a running 4-0 Prolene subcuticular.  Benzoin and Steri-Strips were applied.  Sterile compressive bandage was then applied.  The patient was then placed in a shoulder immobilizer and extubated and taken to recovery room in good condition.  POSTPROCEDURE PLAN:  The patient admitted overnight for IV antibiotics and pain control, discharged in the morning when her pain was controlled and see her back in the office in approximately 2 weeks for x-rays. Continue with the sling and immobilization and get her into a therapy regimen and working on some gentle elbow movement and shoulder mobility. Radiographs at each visit.     Madelynn Done, MD     FWO/MEDQ  D:  12/16/2012  T:  12/17/2012  Job:  [id number]

## 2012-12-18 ENCOUNTER — Encounter (HOSPITAL_COMMUNITY): Payer: Self-pay | Admitting: Orthopedic Surgery

## 2012-12-18 NOTE — Progress Notes (Signed)
12/18/12 1200  OT G-codes **NOT FOR INPATIENT CLASS**  Functional Assessment Tool Used Clinical observation  Functional Limitation Self care  Self Care Current Status (Z6109) CK  Self Care Discharge Status [phone]) CK  Late entry for 06/20/2103

## 2012-12-18 NOTE — Progress Notes (Signed)
Occupational Therapy Treatment Patient Details Name: Janet Houston MRN: [id number] DOB: 11-Sep-1950 Today's Date: 12/18/2012 Time: 4401-0272 OT Time Calculation (min): 32 min  OT Assessment / Plan / Recommendation Comments on Treatment Session  Pt and husband instructed in hand, wrist, forearm, elbow HEP.  Stressed edema management and need for elbow extension.  They returned demonstration indicating understanding.  Pt instructed to keep pain 4-5/10 with exercise.    Follow Up Recommendations  Outpatient OT (at MD discretion)    Barriers to Discharge       Equipment Recommendations  None recommended by OT    Recommendations for Other Services    Frequency Min 2X/week   Plan Discharge plan remains appropriate    Precautions / Restrictions Precautions Precautions: Shoulder Required Braces or Orthoses: Other Brace/Splint Other Brace/Splint: Shoulder immobilizer sling Restrictions Weight Bearing Restrictions: Yes LUE Weight Bearing: Non weight bearing   Pertinent Vitals/Pain     ADL  ADL Comments: Pt preparing for discharge.  Discussed methods for donning shirt, she states she knows how to manage UB dressing.  Pt. with 3+ pitting edema hand and forearm.  Retrograde massage performed, and pt able to return demonstration of how to perofrm,  discussed need for aggressive icing and elevation to manage edema.  Pt. with -45* elbow extension.  Instructed pt on need to gain elbow extension and instructed her to place towel roll under elbow and work on isolated extension - achieved ~-20*.  Reinforced AROM fingers, wrist, elbow and gentle ROM shoulder and need for aggressive edema management.  She and husband verbalized understanding and returned demon of all     OT Diagnosis:    OT Problem List:   OT Treatment Interventions:     OT Goals Acute Rehab OT Goals Time For Goal Achievement: 12/24/12 Potential to Achieve Goals: Good Arm Goals Additional Arm Goal #1: Pt will be Independent  with LUE exercises (AAROM left elbow, SROM left shoulder abduction, massage to LUE) Arm Goal: Additional Goal #1 - Progress: Met  Visit Information  Last OT Received On: 12/18/12 Assistance Needed: +1    Subjective Data      Prior Functioning       Cognition  Cognition Arousal/Alertness: Awake/alert Behavior During Therapy: WFL for tasks assessed/performed Overall Cognitive Status: Within Functional Limits for tasks assessed    Mobility  Transfers Transfers: Sit to Stand;Stand to Sit Sit to Stand: 7: Independent Stand to Sit: 7: Independent    Exercises      Balance     End of Session OT - End of Session Activity Tolerance: Patient tolerated treatment well Patient left: in bed;with call bell/phone within reach;with family/visitor present  GO     Donovan Gatchel M 12/18/2012, 10:07 AM

## 2012-12-18 NOTE — Discharge Summary (Signed)
Physician Discharge Summary  Patient ID: RAIMA GEATHERS MRN: [id number] DOB/AGE: 09-26-1950 62 y.o.  Admit date: 12/16/2012 Discharge date: 12/18/2012  Admission Diagnoses: Left Humeral Shaft Fracture Past Medical History  Diagnosis Date  . IBS (irritable bowel syndrome)   . H/O hiatal hernia     Discharge Diagnoses:  Same   Surgeries: Procedure(s): OPEN REDUCTION INTERNAL FIXATION (ORIF) LEFT HUMERAL SHAFT FRACTURE on 12/16/2012    Consultants:    Discharged Condition: Improved  Hospital Course: LILIYA FULLENWIDER is an 62 y.o. female who was admitted 12/16/2012 with a chief complaint of No chief complaint on file. , and found to have a diagnosis of Left Humeral Shaft Fracture.  They were brought to the operating room on 12/16/2012 and underwent Procedure(s): OPEN REDUCTION INTERNAL FIXATION (ORIF) LEFT HUMERAL SHAFT FRACTURE.    They were given perioperative antibiotics: Anti-infectives   Start     Dose/Rate Route Frequency Ordered Stop   12/17/12 1500  vancomycin (VANCOCIN) 1,500 mg in sodium chloride 0.9 % 500 mL IVPB  Status:  Discontinued     1,500 mg 250 mL/hr over 120 Minutes Intravenous Every 24 hours 12/16/12 2104 12/17/12 1304   12/17/12 1500  vancomycin (VANCOCIN) 1,500 mg in sodium chloride 0.9 % 500 mL IVPB     1,500 mg 250 mL/hr over 120 Minutes Intravenous Every 24 hours 12/17/12 1304 12/17/12 1651   12/17/12 0600  vancomycin (VANCOCIN) IVPB 1000 mg/200 mL premix     1,000 mg 200 mL/hr over 60 Minutes Intravenous On call to O.R. 12/16/12 1233 12/16/12 1506   12/16/12 1238  vancomycin (VANCOCIN) 1 GM/200ML IVPB    Comments:  CRABTREE, TERESA: cabinet override      12/16/12 1238 12/16/12 1510    .  They were given sequential compression devices, early ambulation, and Other (comment)Ambulation for DVT prophylaxis.  Recent vital signs: Patient Vitals for the past 24 hrs:  BP Temp Temp src Pulse Resp SpO2  12/18/12 0523 100/62 mmHg 98.3 F (36.8 C) Oral 85 18 97  %  12/17/12 2118 102/59 mmHg 98 F (36.7 C) Oral 85 18 100 %  12/17/12 1417 96/59 mmHg 98.6 F (37 C) Oral 86 17 100 %  .  Recent laboratory studies: Dg Humerus Left  12/16/2012   *RADIOLOGY REPORT*  Clinical Data:  Left humeral ORIF  LEFT HUMERUS - 2+ VIEW,DG C-ARM 61-120 MIN  Comparison: 11/29/2012  Findings: Technique:  C-arm fluoroscopic images were obtained intraoperatively and submitted for postoperative interpretation. Please see the performing provider's procedural report for the fluoroscopy time utilized.  Seven digital C-arm fluoroscopic images submitted. A plate and multiple screws are identified across a reduced oblique fracture of the left humeral diaphysis. Shoulder and elbow joints are not imaged. No acute bony abnormalities identified.  IMPRESSION: Post ORIF of a reduced oblique left humeral diaphyseal fracture.   Original Report Authenticated By: Ulyses Southward, M.D.   Dg C-arm [phone] Min  12/16/2012   *RADIOLOGY REPORT*  Clinical Data:  Left humeral ORIF  LEFT HUMERUS - 2+ VIEW,DG C-ARM 61-120 MIN  Comparison: 11/29/2012  Findings: Technique:  C-arm fluoroscopic images were obtained intraoperatively and submitted for postoperative interpretation. Please see the performing provider's procedural report for the fluoroscopy time utilized.  Seven digital C-arm fluoroscopic images submitted. A plate and multiple screws are identified across a reduced oblique fracture of the left humeral diaphysis. Shoulder and elbow joints are not imaged. No acute bony abnormalities identified.  IMPRESSION: Post ORIF of a reduced oblique left humeral diaphyseal  fracture.   Original Report Authenticated By: Ulyses Southward, M.D.    Discharge Medications:     Medication List    TAKE these medications       cyclobenzaprine 10 MG tablet  Commonly known as:  FLEXERIL  Take 5-10 mg by mouth daily as needed for muscle spasms.     docusate sodium 100 MG capsule  Commonly known as:  COLACE  Take 1 capsule (100 mg  total) by mouth 2 (two) times daily.     ESTRACE 0.5 MG tablet  Generic drug:  estradiol  Take 1.5 mg by mouth daily.     HYDROcodone-acetaminophen 7.5-325 MG per tablet  Commonly known as:  NORCO  Take 1-2 tablets by mouth every 6 (six) hours as needed for pain.     HYDROmorphone 2 MG tablet  Commonly known as:  DILAUDID  Take 1 tablet (2 mg total) by mouth every 4 (four) hours as needed for pain (do not take with percocet, may alternate medication).     medroxyPROGESTERone 5 MG tablet  Commonly known as:  PROVERA  Take 5 mg by mouth daily.     methocarbamol 500 MG tablet  Commonly known as:  ROBAXIN  Take 1 tablet (500 mg total) by mouth 4 (four) times daily.     multivitamin with minerals Tabs  Take 1 tablet by mouth daily.     oxyCODONE-acetaminophen 5-325 MG per tablet  Commonly known as:  ROXICET  Take 1 tablet by mouth every 4 (four) hours as needed for pain.     vitamin C 500 MG tablet  Commonly known as:  ASCORBIC ACID  Take 1 tablet (500 mg total) by mouth daily.        Diagnostic Studies: Dg Humerus Left  12/16/2012   *RADIOLOGY REPORT*  Clinical Data:  Left humeral ORIF  LEFT HUMERUS - 2+ VIEW,DG C-ARM 61-120 MIN  Comparison: 11/29/2012  Findings: Technique:  C-arm fluoroscopic images were obtained intraoperatively and submitted for postoperative interpretation. Please see the performing provider's procedural report for the fluoroscopy time utilized.  Seven digital C-arm fluoroscopic images submitted. A plate and multiple screws are identified across a reduced oblique fracture of the left humeral diaphysis. Shoulder and elbow joints are not imaged. No acute bony abnormalities identified.  IMPRESSION: Post ORIF of a reduced oblique left humeral diaphyseal fracture.   Original Report Authenticated By: Ulyses Southward, M.D.   Dg Humerus Left  11/29/2012   *RADIOLOGY REPORT*  Clinical Data: Fall, arm pain.  LEFT HUMERUS - 2+ VIEW  Comparison: None  Findings: There is an  oblique displaced and angulated fracture through the midshaft of the left humerus.  Overlapping fracture fragments.  No subluxation or dislocation in the left shoulder or elbow.  IMPRESSION: Displaced, angulated midshaft left humeral fracture.   Original Report Authenticated By: Charlett Nose, M.D.   Dg C-arm [phone] Min  12/16/2012   *RADIOLOGY REPORT*  Clinical Data:  Left humeral ORIF  LEFT HUMERUS - 2+ VIEW,DG C-ARM 61-120 MIN  Comparison: 11/29/2012  Findings: Technique:  C-arm fluoroscopic images were obtained intraoperatively and submitted for postoperative interpretation. Please see the performing provider's procedural report for the fluoroscopy time utilized.  Seven digital C-arm fluoroscopic images submitted. A plate and multiple screws are identified across a reduced oblique fracture of the left humeral diaphysis. Shoulder and elbow joints are not imaged. No acute bony abnormalities identified.  IMPRESSION: Post ORIF of a reduced oblique left humeral diaphyseal fracture.   Original Report Authenticated By: Loraine Leriche  Tyron Russell, M.D.    They benefited maximally from their hospital stay and there were no complications.     Disposition: 01-Home or Self Care      Follow-up Information   Follow up with Sharma Covert, MD. Schedule an appointment as soon as possible for a visit in 15 days.   Contact information:   [address] [place] [address] [phone]     PT SEEN/EXAMINED ON DAY OF DISCHARGE, READY TO GO HOME   Signed: Sharma Covert 12/18/2012, 7:17 AM

## 2013-09-17 ENCOUNTER — Other Ambulatory Visit (HOSPITAL_COMMUNITY)
Admission: RE | Admit: 2013-09-17 | Discharge: 2013-09-17 | Disposition: A | Discharge status: Home / Self Care | Payer: 59 | Source: Ambulatory Visit | Attending: Obstetrics & Gynecology | Admitting: Obstetrics & Gynecology

## 2013-09-17 ENCOUNTER — Other Ambulatory Visit: Payer: Self-pay | Admitting: Obstetrics & Gynecology

## 2013-09-17 DIAGNOSIS — Z01419 Encounter for gynecological examination (general) (routine) without abnormal findings: Secondary | ICD-10-CM | POA: Insufficient documentation

## 2013-09-17 DIAGNOSIS — Z1151 Encounter for screening for human papillomavirus (HPV): Secondary | ICD-10-CM | POA: Insufficient documentation

## 2015-09-22 DIAGNOSIS — K580 Irritable bowel syndrome with diarrhea: Secondary | ICD-10-CM | POA: Insufficient documentation

## 2015-10-05 ENCOUNTER — Other Ambulatory Visit: Payer: Self-pay

## 2015-10-05 DIAGNOSIS — Z1231 Encounter for screening mammogram for malignant neoplasm of breast: Secondary | ICD-10-CM

## 2015-11-02 ENCOUNTER — Ambulatory Visit: Payer: Managed Care, Other (non HMO)

## 2016-02-08 ENCOUNTER — Ambulatory Visit: Payer: Managed Care, Other (non HMO)

## 2016-02-29 ENCOUNTER — Ambulatory Visit: Payer: Managed Care, Other (non HMO)

## 2016-03-07 ENCOUNTER — Ambulatory Visit: Payer: Managed Care, Other (non HMO)

## 2016-03-15 ENCOUNTER — Ambulatory Visit: Payer: Managed Care, Other (non HMO)

## 2016-06-10 ENCOUNTER — Ambulatory Visit
Admission: RE | Admit: 2016-06-10 | Discharge: 2016-06-10 | Disposition: A | Discharge status: Home / Self Care | Payer: Medicare Other | Source: Ambulatory Visit

## 2016-06-10 DIAGNOSIS — Z1231 Encounter for screening mammogram for malignant neoplasm of breast: Secondary | ICD-10-CM

## 2016-10-23 DIAGNOSIS — H40013 Open angle with borderline findings, low risk, bilateral: Secondary | ICD-10-CM | POA: Insufficient documentation

## 2016-10-23 DIAGNOSIS — H2513 Age-related nuclear cataract, bilateral: Secondary | ICD-10-CM | POA: Insufficient documentation

## 2017-08-28 DIAGNOSIS — H16223 Keratoconjunctivitis sicca, not specified as Sjogren's, bilateral: Secondary | ICD-10-CM | POA: Insufficient documentation

## 2017-08-28 DIAGNOSIS — H524 Presbyopia: Secondary | ICD-10-CM | POA: Insufficient documentation

## 2017-08-28 DIAGNOSIS — H5203 Hypermetropia, bilateral: Secondary | ICD-10-CM | POA: Insufficient documentation

## 2017-08-28 DIAGNOSIS — H43813 Vitreous degeneration, bilateral: Secondary | ICD-10-CM | POA: Insufficient documentation

## 2018-05-13 ENCOUNTER — Other Ambulatory Visit: Payer: Self-pay | Admitting: Obstetrics & Gynecology

## 2018-05-13 DIAGNOSIS — Z1231 Encounter for screening mammogram for malignant neoplasm of breast: Secondary | ICD-10-CM

## 2018-06-19 ENCOUNTER — Ambulatory Visit: Payer: Medicare Other

## 2018-09-11 ENCOUNTER — Ambulatory Visit: Payer: Medicare Other

## 2018-10-14 ENCOUNTER — Ambulatory Visit: Payer: Medicare Other

## 2019-02-19 ENCOUNTER — Ambulatory Visit
Admission: RE | Admit: 2019-02-19 | Discharge: 2019-02-19 | Disposition: A | Discharge status: Home / Self Care | Payer: Medicare Other | Source: Ambulatory Visit | Attending: Obstetrics & Gynecology | Admitting: Obstetrics & Gynecology

## 2019-02-19 ENCOUNTER — Other Ambulatory Visit: Payer: Self-pay

## 2019-02-19 DIAGNOSIS — Z1231 Encounter for screening mammogram for malignant neoplasm of breast: Secondary | ICD-10-CM

## 2019-06-17 ENCOUNTER — Other Ambulatory Visit: Payer: Self-pay

## 2019-06-17 ENCOUNTER — Ambulatory Visit: Payer: Medicare Other | Attending: Family Medicine | Admitting: Physical Therapy

## 2019-06-17 DIAGNOSIS — M6281 Muscle weakness (generalized): Secondary | ICD-10-CM | POA: Insufficient documentation

## 2019-06-17 DIAGNOSIS — R293 Abnormal posture: Secondary | ICD-10-CM | POA: Diagnosis present

## 2019-06-17 DIAGNOSIS — M62838 Other muscle spasm: Secondary | ICD-10-CM | POA: Diagnosis present

## 2019-06-17 DIAGNOSIS — M542 Cervicalgia: Secondary | ICD-10-CM

## 2019-06-17 NOTE — Patient Instructions (Signed)
Access Code: [email or web address]  URL: [email or web address]  Date: 06/17/2019  Prepared by: Jeral Pinch   Exercises  Supine shoulder flexion with band - 10 reps - 3 sets - 1x daily  Supine Shoulder Horizontal Abduction with Resistance - 10 reps - 3 sets - 1x daily  Supine Shoulder External Rotation with Resistance - 10 reps - 3 sets - 1x daily  Thoracic Extension Mobilization on Foam Roll - 30-60 hold - 1x daily  Patient Education  Trigger Point Dry Needling

## 2019-06-17 NOTE — Therapy (Signed)
[facility] [address] [place], [place], [address] Phone: [phone]   Fax:  [phone]  Physical Therapy Evaluation  Patient Details  Name: Janet KayKathy Ann Houston MRN: [id number] Date of Birth: 09/14/1950 Referring Provider (PT): Dr Delbert HarnessKim Briscoe   Encounter Date: 06/17/2019  PT End of Session - 06/17/19 1002    Visit Number  1    Number of Visits  8    Date for PT Re-Evaluation  07/15/19    Authorization Type  [employer] - pnote at 10th visit    PT Start Time  1002    PT Stop Time  1045    PT Time Calculation (min)  43 min    Activity Tolerance  Patient tolerated treatment well    Behavior During Therapy  [facility] for tasks assessed/performed       Past Medical History:  Diagnosis Date  . H/O hiatal hernia   . IBS (irritable bowel syndrome)     Past Surgical History:  Procedure Laterality Date  . BREAST EXCISIONAL BIOPSY Left   . BREAST SURGERY Right    Lumpectomy  . COLONOSCOPY W/ POLYPECTOMY    . HAMMER TOE SURGERY     5th toe, bone removed  . ORIF HUMERUS FRACTURE Left 12/16/2012   Procedure: OPEN REDUCTION INTERNAL FIXATION (ORIF) LEFT HUMERAL SHAFT FRACTURE;  Surgeon: Sharma CovertFred W Ortmann, MD;  [facility];  Service: Orthopedics;  Laterality: Left;    There were no vitals filed for this visit.   Subjective Assessment - 06/17/19 1002    Subjective  Pt reports insidious onset of neck pain .  Possibly a combination of the way she sleeps and being on the computer.  Some days are really bad and some days better.Pt reports she does fall a lot because she moves fast and is always doing stuff.  April was her last fall.    Pertinent History  Lt fx clavicle 4 yrs ago - non surgery, ORIF Lt humerous 5 yrs ago. h/o back issues and arthritis    Diagnostic tests  none    Patient Stated Goals  understand what the neck pain is from and decrease it    Currently in Pain?  Yes    Pain Score  6     Pain Location  Neck    Pain  Orientation  Right;Lower    Pain Descriptors / Indicators  Tightness    Pain Type  Chronic pain    Pain Radiating Towards  into Rt shoulder and mid back - moves around    Pain Onset  More than a month ago    Pain Frequency  Intermittent    Aggravating Factors   sleeping and computer work    Pain Relieving Factors  heat,         [facility] PT Assessment - 06/17/19 0001      Assessment   Medical Diagnosis  Neck pain     Referring Provider (PT)  Dr Delbert HarnessKim Briscoe    Onset Date/Surgical Date  09/16/18    Hand Dominance  Right    Next MD Visit  beginning of december    Prior Therapy  none      Precautions   Precautions  None      Balance Screen   Has the patient fallen in the past 6 months  Yes    How many times?  1   tripped over dog   Has the patient had a decrease  in activity level because of a fear of falling?   No    Is the patient reluctant to leave their home because of a fear of falling?   No      Home Public house manager residence    Living Arrangements  Spouse/significant other      Prior Function   Level of Independence  Independent    Vocation  Part time employment    Paediatric nurse - retiring next month    Leisure  Scientist, forensic      Observation/Other Assessments   Focus on Therapeutic Outcomes (FOTO)   65% limited      Posture/Postural Control   Posture/Postural Control  Postural limitations    Postural Limitations  Forward head;Rounded Shoulders   thin framed     ROM / Strength   AROM / PROM / Strength  AROM;Strength      AROM   AROM Assessment Site  Shoulder;Cervical   pain Rt upper trap with cervical motions    Right/Left Shoulder  --   bilat WNL   Cervical Flexion  40     Cervical Extension  28    Cervical - Right Rotation  50    Cervical - Left Rotation  62      Strength   Overall Strength Comments  mid/upper back 4-/5 with pain    Strength Assessment Site  Shoulder;Elbow    Right/Left Shoulder   --   bilat WNL slight weakness Lt ER 4/5   Right/Left Elbow  --   WNL     Palpation   Spinal mobility  hypomobile with CPA mobs cervical and thoracic with pain.  Movement improved with repetition    Palpation comment  hypersensitive to palpation bilat upper traps, levators, pericervical, cervical paraspinals and scalenes Rt > Lt       Special Tests   Other special tests  spurlings and cervical distraction (-)                 Objective measurements completed on examination: See above findings.      [facility] Adult PT Treatment/Exercise - 06/17/19 0001      Exercises   Exercises  Neck      Neck Exercises: Supine   Other Supine Exercise  10 reps, red band, in hooklying shoulder flex/horizontal abduction, ER.  Thoracic extension over yoga mat             PT Education - 06/17/19 1047    Education Details  HEP, DN and POC    Person(s) Educated  Patient    Methods  Explanation;Demonstration;Handout    Comprehension  Returned demonstration;Verbalized understanding          PT Long Term Goals - 06/17/19 1236      PT LONG TERM GOAL #1   Title  I with advanced HEP for postural stabilzation ( 07/15/2019)    Time  4    Period  Weeks    Status  New    Target Date  07/15/19      PT LONG TERM GOAL #2   Title  report =/> 75% reduction in neck and Rt shoulder pain with daily activity ( 07/15/2019)    Time  4    Period  Weeks    Status  New    Target Date  07/15/19      PT LONG TERM GOAL #3   Title  improve FOTO =/< 48% limited ( 07/15/2019)  Time  4    Period  Weeks    Status  New    Target Date  07/15/19      PT LONG TERM GOAL #4   Title  improve upper back strength =/> 4+/5 to support upright posture without pain at the end of the day ( 07/15/2019)    Time  4    Period  Weeks    Status  New    Target Date  07/15/19             Plan - 06/17/19 1230    Clinical Impression Statement  68 yo female with insideous onset of neck pain that has gotten  progressively worse since Feb.  Her pain is mainly in the Rt shoulder and cervical region.  Cervical ROM is limited however bilat shoulder ROM is WNL. Overall UE strength is good, does have weakness in her upper back along with some postural changes.  She is hypomobile through the cervical and thoracic spine and has a lot of muscular tightness with hypersensitivity to palpation.  She would benefit from PT to address these defecits, perform some DN, spinal mobs and STM along with postural correction exercise and modalities for pain as needed.    Personal Factors and Comorbidities  Comorbidity 2;Time since onset of injury/illness/exacerbation    Comorbidities  arthitis, ORIF Lt humerus with old clavicle fx    Examination-Activity Limitations  Sleep;Lift    Examination-Participation Restrictions  Meal Prep;Shop;Laundry;Yard Work    Stability/Clinical Decision Making  Stable/Uncomplicated    Designer, jewellery  Low    Rehab Potential  Excellent    PT Frequency  2x / week    PT Duration  4 weeks    PT Treatment/Interventions  Iontophoresis 4mg /ml Dexamethasone;Vasopneumatic Device;Patient/family education;Moist Heat;Traction;Ultrasound;Therapeutic exercise;Passive range of motion;Spinal Manipulations;Joint Manipulations;Dry needling;Cryotherapy;Electrical Stimulation;Manual techniques;Taping    PT Next Visit Plan  DN to Rt upper trap/levator, cervical paraspinals., cervical and thoracic mobs, postural ex    Consulted and Agree with Plan of Care  Patient       Patient will benefit from skilled therapeutic intervention in order to improve the following deficits and impairments:  Decreased range of motion, Impaired UE functional use, Increased muscle spasms, Pain, Postural dysfunction, Decreased strength  Visit Diagnosis: Cervicalgia - Plan: PT plan of care cert/re-cert  Other muscle spasm - Plan: PT plan of care cert/re-cert  Muscle weakness (generalized) - Plan: PT plan of care  cert/re-cert  Abnormal posture - Plan: PT plan of care cert/re-cert     Problem List There are no active problems to display for this patient.   Jeral Pinch PT  06/17/2019, 12:41 PM  [place] [address] [place], [place], [address] Phone: [phone]   Fax:  [phone]  Name: Cheyane Ayon MRN: [id number] Date of Birth: September 16, 1950

## 2019-06-22 ENCOUNTER — Ambulatory Visit: Payer: Medicare Other | Admitting: Physical Therapy

## 2019-06-28 ENCOUNTER — Other Ambulatory Visit: Payer: Self-pay

## 2019-06-28 ENCOUNTER — Ambulatory Visit: Payer: Medicare Other | Admitting: Physical Therapy

## 2019-06-28 ENCOUNTER — Encounter: Payer: Self-pay | Admitting: Physical Therapy

## 2019-06-28 DIAGNOSIS — R293 Abnormal posture: Secondary | ICD-10-CM

## 2019-06-28 DIAGNOSIS — M6281 Muscle weakness (generalized): Secondary | ICD-10-CM

## 2019-06-28 DIAGNOSIS — M542 Cervicalgia: Secondary | ICD-10-CM

## 2019-06-28 DIAGNOSIS — M62838 Other muscle spasm: Secondary | ICD-10-CM

## 2019-06-28 NOTE — Therapy (Addendum)
[facility] [address] [place], [place], [address] Phone: [phone]   Fax:  [phone]  Physical Therapy Treatment  Patient Details  Name: Janet Houston MRN: [id number] Date of Birth: 06-12-1951 Referring Provider (PT): Dr Delbert Harness   Encounter Date: 06/28/2019  PT End of Session - 06/28/19 0914    Visit Number  2    Number of Visits  8    Date for PT Re-Evaluation  07/15/19    Authorization Type  [employer] - pnote at 10th visit    PT Start Time  0915    PT Stop Time  1004    PT Time Calculation (min)  49 min    Activity Tolerance  Patient tolerated treatment well    Behavior During Therapy  [facility] for tasks assessed/performed       Past Medical History:  Diagnosis Date  . H/O hiatal hernia   . IBS (irritable bowel syndrome)     Past Surgical History:  Procedure Laterality Date  . BREAST EXCISIONAL BIOPSY Left   . BREAST SURGERY Right    Lumpectomy  . COLONOSCOPY W/ POLYPECTOMY    . HAMMER TOE SURGERY     5th toe, bone removed  . ORIF HUMERUS FRACTURE Left 12/16/2012   Procedure: OPEN REDUCTION INTERNAL FIXATION (ORIF) LEFT HUMERAL SHAFT FRACTURE;  Surgeon: Sharma Covert, MD;  Location[facility];  Service: Orthopedics;  Laterality: Left;    There were no vitals filed for this visit.  Subjective Assessment - 06/28/19 0915    Subjective  Pt reports her HEP is going well, does have pain with the thoracic  openers - they are difficulty for her.    Currently in Pain?  Yes    Pain Score  6     Pain Location  Neck    Pain Orientation  Right    Pain Descriptors / Indicators  Tightness   muscle pain   Pain Type  Chronic pain    Pain Onset  More than a month ago    Pain Frequency  Intermittent    Aggravating Factors   no change    Pain Relieving Factors  heat         [facility] PT Assessment - 06/28/19 0001      Assessment   Medical Diagnosis  Neck pain       AROM   Cervical - Right Rotation  48     Cervical - Left Rotation  80                   [facility] Adult PT Treatment/Exercise - 06/28/19 0001      Neck Exercises: Seated   X to V  10 reps   VC for scap retraction   Shoulder Rolls  Backwards;10 reps      Neck Exercises: Supine   Other Supine Exercise  hooklying - 10 reps each, scarecrow to goal posts overhead stretching and then flow - putting movements together      Neck Exercises: Prone   Other Prone Exercise  quadruped - hand behind head - thoracic rotations then thread the needle      Modalities   Modalities  Electrical Stimulation;Moist Heat      Moist Heat Therapy   Number Minutes Moist Heat  15 Minutes    Moist Heat Location  Cervical      Electrical Stimulation   Electrical Stimulation Location  cervical and Rt upper shoulder  Electrical Stimulation Action  IFC - in supine    Electrical Stimulation Parameters  to tolerance     Electrical Stimulation Goals  Pain;Tone      Manual Therapy   Manual Therapy  Soft tissue mobilization    Manual therapy comments  skilled palpation and monitoring through out DN    Soft tissue mobilization  STM to Rt upper trap, levator and cervical paraspinal bilat.  With TPR at attachment of levator      Neck Exercises: Stretches   Upper Trapezius Stretch  Right;10 seconds;3 reps    Levator Stretch  Right;3 reps;10 seconds       Trigger Point Dry Needling - 06/28/19 0001    Consent Given?  Yes    Education Handout Provided  Yes    Muscles Treated Head and Neck  Upper trapezius;Levator scapulae;Cervical multifidi    Upper Trapezius Response  Twitch reponse elicited;Palpable increased muscle length   Rt   Levator Scapulae Response  Palpable increased muscle length;Twitch response elicited   Rt   Cervical multifidi Response  --   not tolerated on the Rt side today               PT Long Term Goals - 06/17/19 1236      PT LONG TERM GOAL #1   Title  I with advanced HEP for postural stabilzation (  07/15/2019)    Time  4    Period  Weeks    Status  New    Target Date  07/15/19      PT LONG TERM GOAL #2   Title  report =/> 75% reduction in neck and Rt shoulder pain with daily activity ( 07/15/2019)    Time  4    Period  Weeks    Status  New    Target Date  07/15/19      PT LONG TERM GOAL #3   Title  improve FOTO =/< 48% limited ( 07/15/2019)    Time  4    Period  Weeks    Status  New    Target Date  07/15/19      PT LONG TERM GOAL #4   Title  improve upper back strength =/> 4+/5 to support upright posture without pain at the end of the day ( 07/15/2019)    Time  4    Period  Weeks    Status  New    Target Date  07/15/19            Plan - 06/28/19 0941    Clinical Impression Statement  This is [name]'s second visit.  She tolerated DN snd manual work well with good releases.  She reported ease of movement after tx. She demo'd improved LT cervical rotation.  She will benefit from continued tx to include cervical mobs.    Rehab Potential  Excellent    PT Frequency  2x / week    PT Duration  4 weeks    PT Treatment/Interventions  Iontophoresis 4mg /ml Dexamethasone;Vasopneumatic Device;Patient/family education;Moist Heat;Traction;Ultrasound;Therapeutic exercise;Passive range of motion;Spinal Manipulations;Joint Manipulations;Dry needling;Cryotherapy;Electrical Stimulation;Manual techniques;Taping    PT Next Visit Plan  assess need for more DN, scapular and cervical stabilization and thoracic mobilizations    Consulted and Agree with Plan of Care  Patient       Patient will benefit from skilled therapeutic intervention in order to improve the following deficits and impairments:  Decreased range of motion, Impaired UE functional use, Increased muscle spasms, Pain, Postural dysfunction, Decreased  strength  Visit Diagnosis: Cervicalgia  Other muscle spasm  Muscle weakness (generalized)  Abnormal posture     Problem List There are no active problems to display for  this patient.   Jeral Pinch PT  06/28/2019, 10:09 AM  [address] [place], [place], [address] Phone: [phone]   Fax:  [phone]  Name: Janet Houston MRN: [id number] Date of Birth: [birth year]

## 2019-06-28 NOTE — Patient Instructions (Signed)
Access Code: [email or web address]  URL: [email or web address]  Date: 06/28/2019  Prepared by: Jeral Pinch   Patient Education  TENS Therapy

## 2019-06-30 ENCOUNTER — Other Ambulatory Visit: Payer: Self-pay

## 2019-06-30 ENCOUNTER — Encounter: Payer: Self-pay | Admitting: Physical Therapy

## 2019-06-30 ENCOUNTER — Ambulatory Visit: Payer: Medicare Other | Attending: Family Medicine | Admitting: Physical Therapy

## 2019-06-30 DIAGNOSIS — R293 Abnormal posture: Secondary | ICD-10-CM | POA: Diagnosis present

## 2019-06-30 DIAGNOSIS — M6281 Muscle weakness (generalized): Secondary | ICD-10-CM | POA: Diagnosis present

## 2019-06-30 DIAGNOSIS — M542 Cervicalgia: Secondary | ICD-10-CM | POA: Diagnosis not present

## 2019-06-30 DIAGNOSIS — M62838 Other muscle spasm: Secondary | ICD-10-CM

## 2019-06-30 NOTE — Therapy (Signed)
[place], [place], [address] Phone: [phone]   Fax:  [phone]  Physical Therapy Treatment  Patient Details  Name: Janet Houston MRN: [id number] Date of Birth: November 05, 1950 Referring Provider (PT): Dr Suzanna Obey   Encounter Date: 06/30/2019  PT End of Session - 06/30/19 0922    Visit Number  3    Number of Visits  8    Date for PT Re-Evaluation  07/15/19    Authorization Type  [employer] - pnote at 10th visit    PT Start Time  0919    PT Stop Time  1015    PT Time Calculation (min)  56 min    Activity Tolerance  Patient tolerated treatment well    Behavior During Therapy  [facility] for tasks assessed/performed       Past Medical History:  Diagnosis Date  . H/O hiatal hernia   . IBS (irritable bowel syndrome)     Past Surgical History:  Procedure Laterality Date  . BREAST EXCISIONAL BIOPSY Left   . BREAST SURGERY Right    Lumpectomy  . COLONOSCOPY W/ POLYPECTOMY    . HAMMER TOE SURGERY     5th toe, bone removed  . ORIF HUMERUS FRACTURE Left 12/16/2012   Procedure: OPEN REDUCTION INTERNAL FIXATION (ORIF) LEFT HUMERAL SHAFT FRACTURE;  Surgeon: Linna Hoff, MD;  Location: [place];  Service: Orthopedics;  Laterality: Left;    There were no vitals filed for this visit.  Subjective Assessment - 06/30/19 0920    Subjective  Pt reports she is still having pain however reports less tightness in the lower neck area.  Able to do her HEP easier.    Patient Stated Goals  understand what the neck pain is from and decrease it    Currently in Pain?  Yes    Pain Score  6     Pain Location  Neck    Pain Orientation  Right    Pain Descriptors / Indicators  Tightness    Pain Type  Chronic pain         [facility] PT Assessment - 06/30/19 0001      Assessment   Medical Diagnosis  Neck pain                    [facility] Adult PT Treatment/Exercise - 06/30/19 0001      Neck Exercises:  Machines for Strengthening   UBE (Upper Arm Bike)  L2x3' BWD      Neck Exercises: Supine   Other Supine Exercise  15 reps each, yellow band,. leaning against bolster on wall, horizontal abduction, ER, SASH , then head presses      Modalities   Modalities  Electrical Stimulation;Moist Heat      Moist Heat Therapy   Number Minutes Moist Heat  15 Minutes    Moist Heat Location  Cervical      Electrical Stimulation   Electrical Stimulation Location  cervical and Rt upper shoulder    Electrical Stimulation Action  IFC - in supine    Electrical Stimulation Parameters  to tolerance    Electrical Stimulation Goals  Pain;Tone      Manual Therapy   Manual Therapy  Soft tissue mobilization;Joint mobilization    Manual therapy comments  skilled palpation and monitoring through out DN    Joint Mobilization  grade III cervical and upper thoracic spine CPA and bilat UPA - hypomobile and tender  at C3/4    Soft tissue mobilization  STM to Rt upper trap, levator and cervical paraspinal bilat.  With TPR at attachment of levator      Neck Exercises: Stretches   Upper Trapezius Stretch  Right;10 seconds;3 reps;Left    Levator Stretch  Right;3 reps;10 seconds;Left       Trigger Point Dry Needling - 06/30/19 0001    Consent Given?  Yes    Education Handout Provided  Previously provided    Muscles Treated Head and Neck  Upper trapezius;Levator scapulae;Cervical multifidi    Upper Trapezius Response  Palpable increased muscle length;Twitch reponse elicited    Levator Scapulae Response  Palpable increased muscle length;Twitch response elicited    Cervical multifidi Response  Palpable increased muscle length;Twitch reponse elicited   Rt side able to get good releases today               PT Long Term Goals - 06/30/19 0930      PT LONG TERM GOAL #1   Title  I with advanced HEP for postural stabilzation ( 07/15/2019)    Status  On-going      PT LONG TERM GOAL #2   Title  report =/> 75%  reduction in neck and Rt shoulder pain with daily activity ( 07/15/2019)    Status  On-going      PT LONG TERM GOAL #3   Title  improve FOTO =/< 48% limited ( 07/15/2019)    Status  On-going      PT LONG TERM GOAL #4   Title  improve upper back strength =/> 4+/5 to support upright posture without pain at the end of the day ( 07/15/2019)    Status  On-going            Plan - 06/30/19 1041    Clinical Impression Statement  Janet Houston is responding well to DN and manual therapy, she has increased motion and ease with exericse as well as decreased palpable tightness/trigger points in the neck and Rt shoulder.  She does still have some hypomobility through the cervical spine and would benefit from continued therapy    Rehab Potential  Excellent    PT Frequency  2x / week    PT Duration  4 weeks    PT Treatment/Interventions  Iontophoresis 4mg /ml Dexamethasone;Vasopneumatic Device;Patient/family education;Moist Heat;Traction;Ultrasound;Therapeutic exercise;Passive range of motion;Spinal Manipulations;Joint Manipulations;Dry needling;Cryotherapy;Electrical Stimulation;Manual techniques;Taping    PT Next Visit Plan  assess need for more DN, scapular and cervical stabilization and thoracic mobilizations - progress HEP    Consulted and Agree with Plan of Care  Patient       Patient will benefit from skilled therapeutic intervention in order to improve the following deficits and impairments:  Decreased range of motion, Impaired UE functional use, Increased muscle spasms, Pain, Postural dysfunction, Decreased strength  Visit Diagnosis: Cervicalgia  Other muscle spasm  Muscle weakness (generalized)  Abnormal posture     Problem List There are no active problems to display for this patient.   PT  06/30/2019, 10:43 AM  [facility] [address] [place], [place], [place] Phone: [phone]   Fax:   [phone]  Name: Janet Houston MRN: Mickie Kay Date of Birth: 03-18-1951

## 2019-07-07 ENCOUNTER — Ambulatory Visit: Payer: Medicare Other | Admitting: Physical Therapy

## 2019-07-08 ENCOUNTER — Ambulatory Visit: Payer: Medicare Other | Admitting: Physical Therapy

## 2019-07-13 ENCOUNTER — Ambulatory Visit: Payer: Medicare Other | Admitting: Physical Therapy

## 2019-07-15 ENCOUNTER — Ambulatory Visit: Payer: Medicare Other

## 2019-07-16 DIAGNOSIS — E538 Deficiency of other specified B group vitamins: Secondary | ICD-10-CM | POA: Insufficient documentation

## 2019-07-16 DIAGNOSIS — R232 Flushing: Secondary | ICD-10-CM | POA: Insufficient documentation

## 2019-07-16 DIAGNOSIS — E559 Vitamin D deficiency, unspecified: Secondary | ICD-10-CM | POA: Insufficient documentation

## 2019-08-06 ENCOUNTER — Other Ambulatory Visit: Payer: Self-pay | Admitting: Obstetrics & Gynecology

## 2019-08-06 DIAGNOSIS — E2839 Other primary ovarian failure: Secondary | ICD-10-CM

## 2019-08-12 ENCOUNTER — Other Ambulatory Visit: Payer: Medicare Other

## 2019-11-22 ENCOUNTER — Other Ambulatory Visit: Payer: Medicare Other

## 2020-01-25 ENCOUNTER — Ambulatory Visit
Admission: RE | Admit: 2020-01-25 | Discharge: 2020-01-25 | Disposition: A | Discharge status: Home / Self Care | Payer: Medicare Other | Source: Ambulatory Visit | Attending: Obstetrics & Gynecology | Admitting: Obstetrics & Gynecology

## 2020-01-25 DIAGNOSIS — E2839 Other primary ovarian failure: Secondary | ICD-10-CM

## 2020-02-14 ENCOUNTER — Other Ambulatory Visit: Payer: Self-pay | Admitting: Obstetrics & Gynecology

## 2020-02-14 DIAGNOSIS — Z1231 Encounter for screening mammogram for malignant neoplasm of breast: Secondary | ICD-10-CM

## 2020-02-18 DIAGNOSIS — M8580 Other specified disorders of bone density and structure, unspecified site: Secondary | ICD-10-CM | POA: Insufficient documentation

## 2020-02-24 ENCOUNTER — Ambulatory Visit: Payer: Medicare Other

## 2020-03-14 ENCOUNTER — Ambulatory Visit
Admission: RE | Admit: 2020-03-14 | Discharge: 2020-03-14 | Disposition: A | Discharge status: Home / Self Care | Payer: Medicare Other | Source: Ambulatory Visit | Attending: Obstetrics & Gynecology | Admitting: Obstetrics & Gynecology

## 2020-03-14 ENCOUNTER — Other Ambulatory Visit: Payer: Self-pay

## 2020-03-14 DIAGNOSIS — Z1231 Encounter for screening mammogram for malignant neoplasm of breast: Secondary | ICD-10-CM

## 2020-07-10 DIAGNOSIS — Z85828 Personal history of other malignant neoplasm of skin: Secondary | ICD-10-CM | POA: Insufficient documentation

## 2020-07-10 DIAGNOSIS — L989 Disorder of the skin and subcutaneous tissue, unspecified: Secondary | ICD-10-CM | POA: Insufficient documentation

## 2020-07-10 DIAGNOSIS — M549 Dorsalgia, unspecified: Secondary | ICD-10-CM | POA: Insufficient documentation

## 2020-12-05 ENCOUNTER — Telehealth: Payer: Self-pay

## 2020-12-05 ENCOUNTER — Other Ambulatory Visit: Payer: Self-pay | Admitting: Podiatry

## 2020-12-05 ENCOUNTER — Ambulatory Visit: Payer: Medicare Other | Admitting: Podiatry

## 2020-12-05 ENCOUNTER — Other Ambulatory Visit: Payer: Self-pay

## 2020-12-05 ENCOUNTER — Ambulatory Visit (INDEPENDENT_AMBULATORY_CARE_PROVIDER_SITE_OTHER): Payer: Medicare Other

## 2020-12-05 DIAGNOSIS — Z860100 Personal history of colon polyps, unspecified: Secondary | ICD-10-CM | POA: Insufficient documentation

## 2020-12-05 DIAGNOSIS — M2042 Other hammer toe(s) (acquired), left foot: Secondary | ICD-10-CM | POA: Diagnosis not present

## 2020-12-05 DIAGNOSIS — M21962 Unspecified acquired deformity of left lower leg: Secondary | ICD-10-CM

## 2020-12-05 DIAGNOSIS — M2012 Hallux valgus (acquired), left foot: Secondary | ICD-10-CM

## 2020-12-05 DIAGNOSIS — M79672 Pain in left foot: Secondary | ICD-10-CM

## 2020-12-05 DIAGNOSIS — N959 Unspecified menopausal and perimenopausal disorder: Secondary | ICD-10-CM | POA: Insufficient documentation

## 2020-12-05 NOTE — Progress Notes (Signed)
  Subjective:  Patient ID: Janet Houston, female    DOB: 1951/05/30,  MRN: [id number]  Chief Complaint  Patient presents with  . Foot Problem    2nd digit left foot toe pains for about 1 year now. No injury, intermittent at first, now become more constant. Able to bend. Tx:ice   70 y.o. female presents with the above complaint. History confirmed with patient. Very active, plays golf and is on her feet a lot.  Objective:  Physical Exam: warm, good capillary refill, no trophic changes or ulcerative lesions, normal DP and PT pulses and normal sensory exam. Left Foot: bunionette deformity noted and hammertoe 2nd toe; POP 2nd MPJ and PIPJ left 2nd toe, IPJ hallux No images are attached to the encounter.  Radiographs: X-ray of the left foot: hallux valgus deformity, elongated 2ndmetatarsal and digital contractures. Degenertive changes to the 2nd MPJ both sides. Assessment:   1. Hallux valgus, left   2. Hammer toe of left foot   3. Metatarsal deformity, left   4. Left foot pain    Plan:  Patient was evaluated and treated and all questions answered.  Bunion and Hammertoe -XR reviewed with patient -Educated on etiology of deformity -Discussed proper shoe gear modifications and padding  -Silicone toe spacer dispensed. -Patient has failed all conservative therapy and wishes to proceed with surgical intervention. All risks, benefits, and alternatives discussed with patient. No guarantees given. Consent reviewed and signed by patient. -Planned procedures: Left foot correction of bunion; possible Akin; 2nd toe hammertoe correction, 2nd met shortening osteotomy -DME dispensed for post-op use: [facility]     No follow-ups on file.

## 2020-12-05 NOTE — Telephone Encounter (Signed)
Janet Houston came in to see Dr. Samuella Cota today and they discussed surgery. [name] left the room before coming to see me and scheduling a date for surgery. I left her a message for her to call me if she would like to get surgery set up.

## 2021-01-08 ENCOUNTER — Telehealth: Payer: Self-pay

## 2021-01-08 NOTE — Telephone Encounter (Signed)
Janet Houston called to cancel her surgery with Dr. Samuella Cota on 02/14/2021. She stated her feet are doing better and wants to wait. Notified Dr. Samuella Cota and Aram Beecham with [facility]

## 2021-02-20 ENCOUNTER — Encounter: Payer: Medicare Other | Admitting: Podiatry

## 2021-03-06 ENCOUNTER — Encounter: Payer: Medicare Other | Admitting: Podiatry

## 2021-04-20 ENCOUNTER — Other Ambulatory Visit: Payer: Self-pay | Admitting: Obstetrics and Gynecology

## 2021-04-20 ENCOUNTER — Other Ambulatory Visit: Payer: Self-pay | Admitting: Family Medicine

## 2021-04-20 DIAGNOSIS — Z1231 Encounter for screening mammogram for malignant neoplasm of breast: Secondary | ICD-10-CM

## 2021-05-23 ENCOUNTER — Ambulatory Visit: Payer: Medicare Other

## 2021-06-26 ENCOUNTER — Ambulatory Visit
Admission: RE | Admit: 2021-06-26 | Discharge: 2021-06-26 | Disposition: A | Discharge status: Home / Self Care | Payer: Medicare Other | Source: Ambulatory Visit | Attending: Family Medicine | Admitting: Family Medicine

## 2021-06-26 DIAGNOSIS — Z1231 Encounter for screening mammogram for malignant neoplasm of breast: Secondary | ICD-10-CM

## 2021-08-20 ENCOUNTER — Other Ambulatory Visit: Payer: Self-pay | Admitting: Nurse Practitioner

## 2021-08-20 DIAGNOSIS — M8580 Other specified disorders of bone density and structure, unspecified site: Secondary | ICD-10-CM

## 2022-01-25 ENCOUNTER — Ambulatory Visit
Admission: RE | Admit: 2022-01-25 | Discharge: 2022-01-25 | Disposition: A | Discharge status: Home / Self Care | Payer: Medicare Other | Source: Ambulatory Visit | Attending: Nurse Practitioner | Admitting: Nurse Practitioner

## 2022-01-25 DIAGNOSIS — M8580 Other specified disorders of bone density and structure, unspecified site: Secondary | ICD-10-CM

## 2022-06-18 ENCOUNTER — Other Ambulatory Visit: Payer: Self-pay | Admitting: Family Medicine

## 2022-06-18 DIAGNOSIS — Z1231 Encounter for screening mammogram for malignant neoplasm of breast: Secondary | ICD-10-CM

## 2022-08-16 ENCOUNTER — Ambulatory Visit
Admission: RE | Admit: 2022-08-16 | Discharge: 2022-08-16 | Disposition: A | Discharge status: Home / Self Care | Payer: Medicare Other | Source: Ambulatory Visit | Attending: Family Medicine | Admitting: Family Medicine

## 2022-08-16 DIAGNOSIS — Z1231 Encounter for screening mammogram for malignant neoplasm of breast: Secondary | ICD-10-CM

## 2022-10-16 ENCOUNTER — Ambulatory Visit: Payer: Medicare Other | Admitting: Neurology

## 2022-12-30 ENCOUNTER — Encounter: Payer: Self-pay | Admitting: *Deleted

## 2022-12-31 ENCOUNTER — Ambulatory Visit: Payer: Medicare Other | Admitting: Neurology

## 2022-12-31 ENCOUNTER — Encounter: Payer: Self-pay | Admitting: Neurology

## 2022-12-31 ENCOUNTER — Telehealth: Payer: Self-pay | Admitting: Neurology

## 2022-12-31 VITALS — BP 118/78 | HR 80 | Ht 62.0 in | Wt 112.0 lb

## 2022-12-31 DIAGNOSIS — G2581 Restless legs syndrome: Secondary | ICD-10-CM

## 2022-12-31 DIAGNOSIS — R42 Dizziness and giddiness: Secondary | ICD-10-CM | POA: Diagnosis not present

## 2022-12-31 NOTE — Telephone Encounter (Signed)
[employer] medicare Berkley Harvey: [id number] exp. 12/31/22-01/29/23 sent to GI [phone]

## 2022-12-31 NOTE — Progress Notes (Signed)
Subjective:    Patient ID: Janet Houston is a 72 y.o. female.  HPI    Janet Foley, MD, PhD [facility] [address] [address] [place], [place] [address]  Dear Dr. Earnest Houston,  I saw your patient, Janet Houston, upon your kind request in my sleep clinic today for initial consultation of her sleep disturbance, particularly restless leg symptoms.  The patient is unaccompanied today.  As you know, Janet Houston is a 72 year old female with an underlying medical history of irritable bowel syndrome, vitamin D deficiency, vitamin B12 deficiency, osteopenia, back pain, and hiatal hernia, who reports difficulty sleeping at night without her Lyrica.  She reports that she would rather be evaluated for dizziness.  She is advised that her referral was to address her restless legs and her sleep disturbance.  She reports that she does not like to take medications and Lyrica is the only thing that helps her sleep but she does endorse feeling dizzy at night and dizzy first thing in the morning, she has had several falls.  She does not use a walking aid, she reports that she hydrates well and in fact she was advised to reduce her water intake as she was drinking too much water.  Her Epworth sleepiness score is 1 out of 24, fatigue severity score is 3 out of 63.  She is not sure if anyone in the family has restless leg symptoms.  She has occasional lightheadedness, she has occasional milder headaches, denies any sudden onset of one-sided weakness or numbness or tingling or droopy face or slurring of speech.  She reports that she has had multiple head injuries but did not go to the emergency room and has not had any scans, she would like to get a brain scan.   She has been on Lyrica with modest response to her restless leg symptoms but it causes her to feel dizzy.  She has tried other medications including pramipexole and low-dose, gabapentin 300 mg nightly, she has been on clonidine,  as well as Paxil.  Pramipexole was 0.125 mg strength nightly. I reviewed your office note from 09/06/2022.  She had blood work through your office and I was able to review the results.  Chemistry profile showed BUN of 7, creatinine 0.54, sodium below normal at 130, potassium 4.7, chloride below normal at 93.  She had a recheck on her electrolytes and they were normal in March 2024.  CBC showed benign findings, differential benign as well.  Lipid profile showed elevated total cholesterol at 236, LDL borderline elevated at 113.  Vitamin D level was 59.2, B12 was 527, TSH was 1.65.  In February 2023, her ferritin level was 215.  She reports that her balance is off.  She has multiple medication intolerances, could not tolerate gabapentin and is not sure what side effects she had with the pramipexole.  She denies shortness of breath and chest pain.  She had an updated eye exam yesterday, she does not have any prescription eyeglasses and is followed for glaucoma.  She quit smoking some 12 to 15 years ago.  She does not drink any alcohol, she limits her caffeine to 1-1/2 cups of coffee in the morning.  She works as a Armed forces operational officer.  She is very active physically.   She reports that she had signed up for sleep study and was scheduled for home sleep test but decided to cancel it.   Her Past Medical History Is Significant For: Past Medical History:  Diagnosis Date   Falls    Glaucoma    H/O hiatal hernia    IBS (irritable bowel syndrome)    Lower back pain    RLS (restless legs syndrome)     Her Past Surgical History Is Significant For: Past Surgical History:  Procedure Laterality Date   BREAST EXCISIONAL BIOPSY Left    BREAST SURGERY Right    Lumpectomy   COLONOSCOPY W/ POLYPECTOMY     HAMMER TOE SURGERY     5th toe, bone removed   ORIF HUMERUS FRACTURE Left 12/16/2012   Procedure: OPEN REDUCTION INTERNAL FIXATION (ORIF) LEFT HUMERAL SHAFT FRACTURE;  Surgeon: Janet Covert, MD;  Location: [facility] OR;   Service: Orthopedics;  Laterality: Left;    Her Family History Is Significant For: Family History  Problem Relation Age of Onset   COPD Mother    Cancer - Lung Father    Depression Sister    Valvular heart disease Brother    Hypertension Brother     Her Social History Is Significant For: Social History   Socioeconomic History   Marital status: Married    Spouse name: Not on file   Number of children: Not on file   Years of education: Not on file   Highest education level: Not on file  Occupational History   Not on file  Tobacco Use   Smoking status: Former    Years: 10    Types: Cigarettes   Smokeless tobacco: Never   Tobacco comments:    quit 7 years ago  Vaping Use   Vaping Use: Never used  Substance and Sexual Activity   Alcohol use: No    Comment: occ   Drug use: No   Sexual activity: Not on file  Other Topics Concern   Not on file  Social History Narrative   Caffiene 1.5 cups daily.   Work Armed forces operational officer   Social Determinants of Corporate investment banker Strain: Not on file  Food Insecurity: Not on file  Transportation Needs: Not on file  Physical Activity: Not on file  Stress: Not on file  Social Connections: Not on file    Her Allergies Are:  Allergies  Allergen Reactions   Bee Venom Anaphylaxis   Coconut Fatty Acids Anaphylaxis    Other reaction(s): anaphylaxis   Codeine Anaphylaxis   Penicillins Anaphylaxis   Strawberry Extract     Other reaction(s): Other (See Comments) hives hives    Cocoa Nausea Only   Chocolate Nausea Only   Other Nausea Only  :   Her Current Medications Are:  Outpatient Encounter Medications as of 12/31/2022  Medication Sig   B Complex-C (B-COMPLEX WITH VITAMIN C) tablet Take by mouth.   Cholecalciferol 25 MCG (1000 UT) capsule Take 2,000 Units by mouth daily.   Multiple Vitamin (MULTIVITAMIN WITH MINERALS) TABS Take 1 tablet by mouth daily.   pregabalin (LYRICA) 100 MG capsule Take 100 mg by mouth at bedtime.    [DISCONTINUED] ciclopirox (PENLAC) 8 % solution Apply topically.   [DISCONTINUED] cyclobenzaprine (FLEXERIL) 10 MG tablet Take 5-10 mg by mouth daily as needed for muscle spasms.   [DISCONTINUED] docusate sodium (COLACE) 100 MG capsule Take 1 capsule (100 mg total) by mouth 2 (two) times daily.   [DISCONTINUED] estradiol (ESTRACE) 0.5 MG tablet Take 1.5 mg by mouth daily.    [DISCONTINUED] estrogens, conjugated, (PREMARIN) 0.3 MG tablet    [DISCONTINUED] HYDROcodone-acetaminophen (NORCO) 7.5-325 MG per tablet Take 1-2 tablets by mouth every 6 (six)  hours as needed for pain.   [DISCONTINUED] HYDROmorphone (DILAUDID) 2 MG tablet Take 1 tablet (2 mg total) by mouth every 4 (four) hours as needed for pain (do not take with percocet, may alternate medication).   [DISCONTINUED] medroxyPROGESTERone (PROVERA) 5 MG tablet Take 5 mg by mouth daily.   [DISCONTINUED] methocarbamol (ROBAXIN) 500 MG tablet Take 1 tablet (500 mg total) by mouth 4 (four) times daily.   [DISCONTINUED] multivitamin (VIT W/EXTRA C) CHEW chewable tablet Chew 1 tablet by mouth daily.   [DISCONTINUED] oxyCODONE-acetaminophen (ROXICET) 5-325 MG per tablet Take 1 tablet by mouth every 4 (four) hours as needed for pain.   [DISCONTINUED] PARoxetine Mesylate (BRISDELLE) 7.5 MG CAPS    [DISCONTINUED] triamcinolone ointment (KENALOG) 0.1 % Apply topically.   [DISCONTINUED] vitamin C (ASCORBIC ACID) 500 MG tablet Take 1 tablet (500 mg total) by mouth daily.   No facility-administered encounter medications on file as of 12/31/2022.  :   Review of Systems:  Out of a complete 14 point review of systems, all are reviewed and negative with the exception of these symptoms as listed below:  Review of Systems  Neurological:        Dizziness is her complaint. Since last 6 months.  Has been taking Lyrica and that has helped her RLS.  (? SE of dizziness, progressively getting worse).  Has had falls.     Objective:  Neurological Exam  Physical  Exam Physical Examination:   Vitals:   12/31/22 0818  BP: 118/78  Pulse: 80    General Examination: The patient is a very pleasant 72 y.o. female in no acute distress. She appears well-developed and well-nourished and well groomed.   HEENT: Normocephalic, atraumatic, pupils are equal, round and reactive to light, extraocular tracking is good without limitation to gaze excursion or nystagmus noted. Hearing is grossly intact. Face is symmetric with normal facial animation. Speech is clear with no dysarthria noted. There is no hypophonia. There is no lip, neck/head, jaw or voice tremor. Neck is supple with full range of motion.   Chest: Clear to auscultation without wheezing, rhonchi or crackles noted.  Heart: S1+S2+0, regular and normal without murmurs, rubs or gallops noted.   Abdomen: Soft, non-tender and non-distended.  Extremities: There is no pitting edema in the distal lower extremities bilaterally.  Good pedal pulses.  Skin: Warm and dry without trophic changes noted.   Musculoskeletal: exam reveals arthritic changes in both hands.    Neurologically:  Mental status: The patient is awake, alert and oriented in all 4 spheres. Her immediate and remote memory, attention, language skills and fund of knowledge are appropriate. There is no evidence of aphasia, agnosia, apraxia or anomia. Speech is clear with normal prosody and enunciation. Thought process is linear. Mood is normal and affect is normal.  Cranial nerves II - XII are as described above under HEENT exam.  Motor exam: Normal bulk, strength and tone is noted. There is no obvious action or resting tremor.  Fine motor skills and coordination: grossly intact.  Normal finger taps, hand movements and rapid alternating padding with the upper extremities, normal foot taps bilaterally in the lower extremities. Cerebellar testing: No dysmetria or intention tremor. There is no truncal or gait ataxia.  Normal finger-to-nose, normal  heel-to-shin bilaterally. Reflexes are 2+ throughout including ankles, toes are downgoing bilaterally. Sensory exam: intact to light touch in the upper and lower extremities.  Gait, station and balance: She stands easily. No veering to one side is noted. No leaning  to one side is noted. Posture is age-appropriate and stance is narrow based. Gait shows normal stride length and normal pace. No problems turning are noted.  Tandem walk is challenging in the beginning. Romberg shows swaying with a deliberate component.  No corrective steps.   Assessment and Plan:  In summary, Luella Boutilier is a very pleasant 72 y.o.-year old female with an underlying medical history of irritable bowel syndrome, vitamin D deficiency, vitamin B12 deficiency, osteopenia, back pain, and hiatal hernia, who presents for evaluation of her restless legs and sleep disturbance but wanted to also address her dizziness and prior head injuries.  I had a long discussion with the patient, she was advised that her dizziness is very possibly secondary to medication side effect from the Lyrica especially as she has fallen off the toilet commode as she reported and she feels dizzy more so throughout the night and worse first thing in the morning.  She is advised to talk to you about tapering off the Lyrica and as an alternative she could try low-dose pramipexole again, I would start with 0.125 mg strength and gradually increase in 0.125 mg increments to up to 0.375 mg about 2 hours before bedtime.  She is advised to start using a walker for gait safety.  Neurological exam is as such nonfocal.  She would like to get an MRI of the brain, I would be happy to order 1.  I offered her a sleep study but she declined.  She is advised to follow-up with you and discuss her dizziness in more detail and consider evaluation through cardiology next.  Houston her history and examination, I would suspect that the dizziness stems from the Lyrica.  She does endorse  significant medication intolerances and sensitivity to medication.  She is advised to stay well-hydrated and well rested and we talked about the importance of fall prevention.  If she has any acute symptoms such as one-sided weakness or numbness or tingling or droopy face or slurring of speech or sudden onset of severe headache or another fall, she is advised to seek immediate medical attention by going to the emergency room.  I answered all her questions today and she was in agreement with our plan although she does not indicate that she would be using a walker.  So long as her brain MRI shows age-appropriate findings, we will keep her posted by phone call, she can follow-up in this clinic as needed. Detailed verbal instructions were provided during clinic visit.  Patient verbalized understanding. Thank you very much for allowing me to participate in the care of this nice patient. If I can be of any further assistance to you please do not hesitate to call me at [phone].  Sincerely,   Janet Foley, MD, PhD

## 2023-02-03 ENCOUNTER — Encounter: Payer: Self-pay | Admitting: Neurology

## 2023-02-08 ENCOUNTER — Ambulatory Visit
Admission: RE | Admit: 2023-02-08 | Discharge: 2023-02-08 | Disposition: A | Discharge status: Home / Self Care | Payer: Medicare Other | Source: Ambulatory Visit | Attending: Neurology | Admitting: Neurology

## 2023-02-08 DIAGNOSIS — R42 Dizziness and giddiness: Secondary | ICD-10-CM

## 2023-02-08 DIAGNOSIS — G2581 Restless legs syndrome: Secondary | ICD-10-CM | POA: Diagnosis not present

## 2023-02-08 MED ORDER — GADOPICLENOL 0.5 MMOL/ML IV SOLN
5.0000 mL | Freq: Once | INTRAVENOUS | Status: AC | PRN
  Administered 2023-02-08: 5 mL via INTRAVENOUS

## 2023-02-11 ENCOUNTER — Telehealth: Payer: Self-pay

## 2023-02-11 NOTE — Telephone Encounter (Signed)
I spoke with the patient and relayed the results of the MRI. She verbalized understanding of the findings and expressed appreciation for the call.

## 2023-02-11 NOTE — Telephone Encounter (Signed)
-----   Message from Janet Houston sent at 02/11/2023  8:09 AM EDT ----- Please call and advise patient that her recent brain MRI w/wo contrast was reported as normal.

## 2023-07-09 ENCOUNTER — Institutional Professional Consult (permissible substitution): Payer: Medicare Other | Admitting: Pulmonary Disease

## 2023-07-10 ENCOUNTER — Encounter: Payer: Self-pay | Admitting: Pulmonary Disease

## 2023-07-14 ENCOUNTER — Other Ambulatory Visit: Payer: Self-pay | Admitting: Family Medicine

## 2023-07-14 DIAGNOSIS — Z1231 Encounter for screening mammogram for malignant neoplasm of breast: Secondary | ICD-10-CM

## 2023-08-19 ENCOUNTER — Ambulatory Visit: Payer: Medicare Other

## 2023-09-17 ENCOUNTER — Ambulatory Visit: Payer: Medicare Other

## 2023-10-19 IMAGING — MG MM DIGITAL SCREENING BILAT W/ TOMO AND CAD
6 of 10 series · 6 of 30 positions shown · non-contrast
Comparison: Previous exam(s).

CLINICAL DATA: Screening.

EXAM:
DIGITAL SCREENING BILATERAL MAMMOGRAM WITH TOMOSYNTHESIS AND CAD
TECHNIQUE: Bilateral screening digital craniocaudal and mediolateral oblique
mammograms were obtained. Bilateral screening digital breast
tomosynthesis was performed. The images were evaluated with
computer-aided detection.

[R MLO synth-2D (1 of 2)]
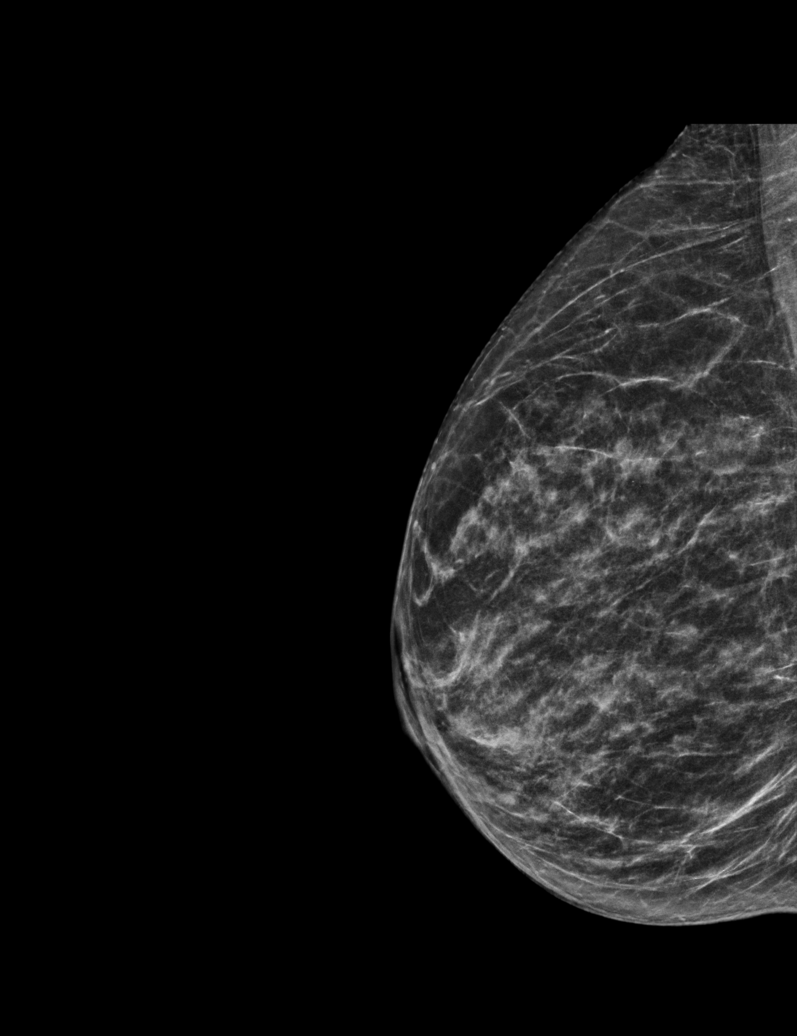

[L CC synth-2D]
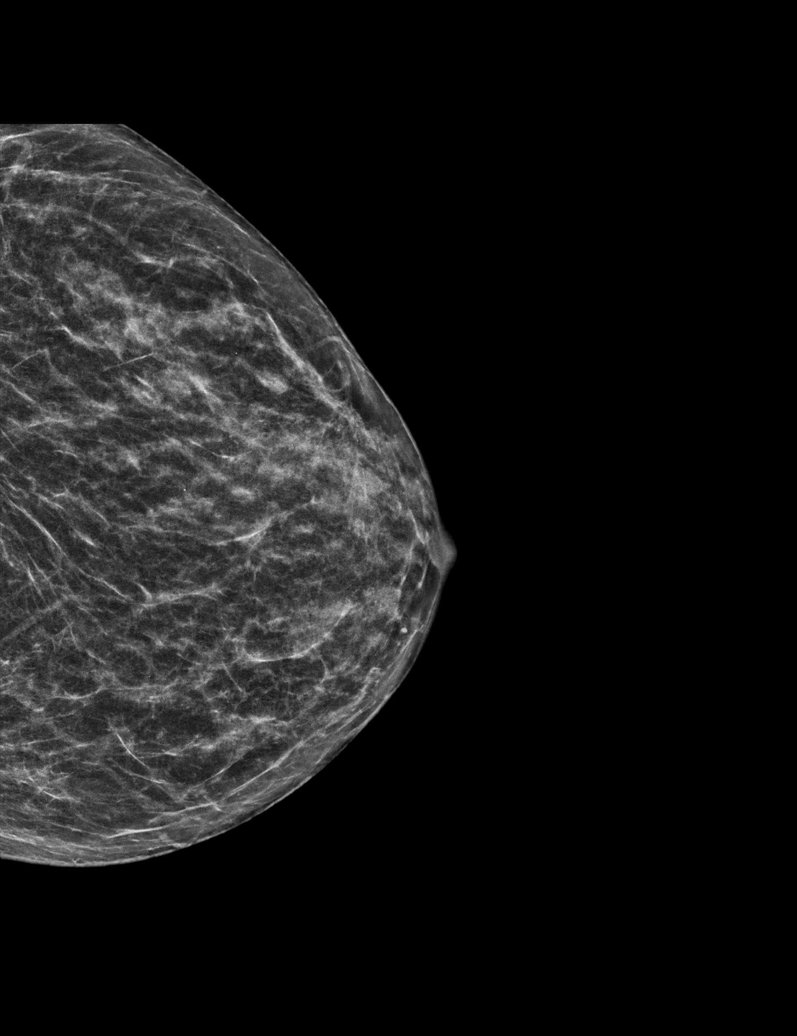

[R MLO synth-2D (2 of 2)]
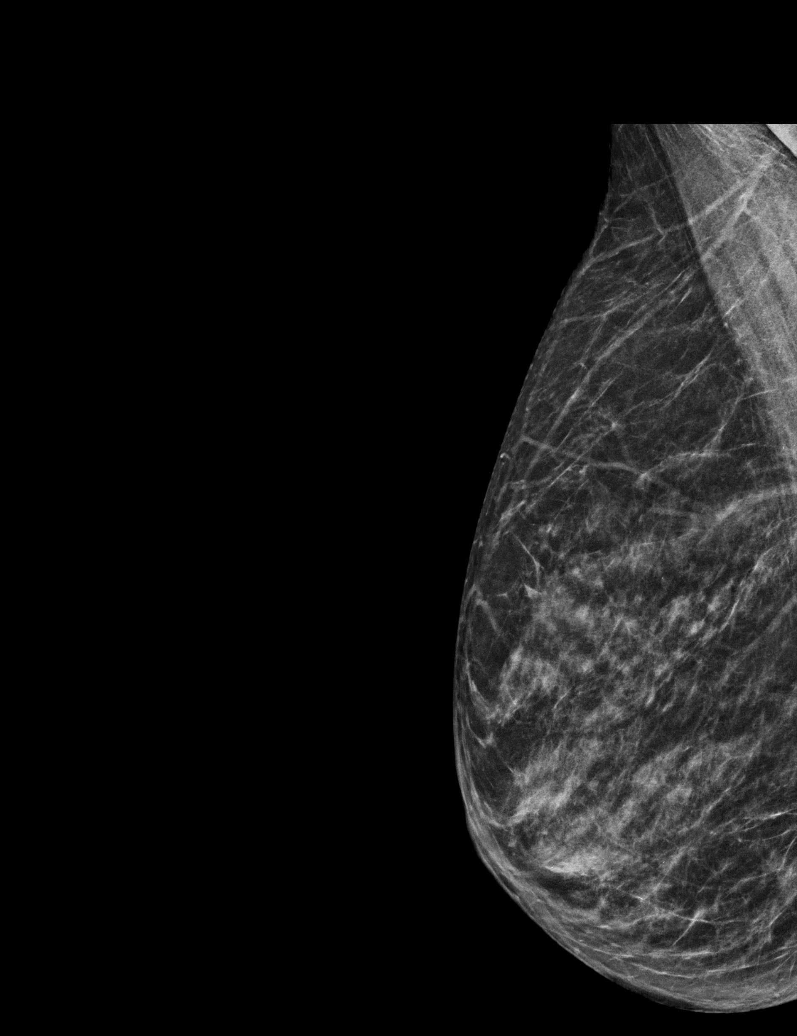

[L MLO synth-2D]
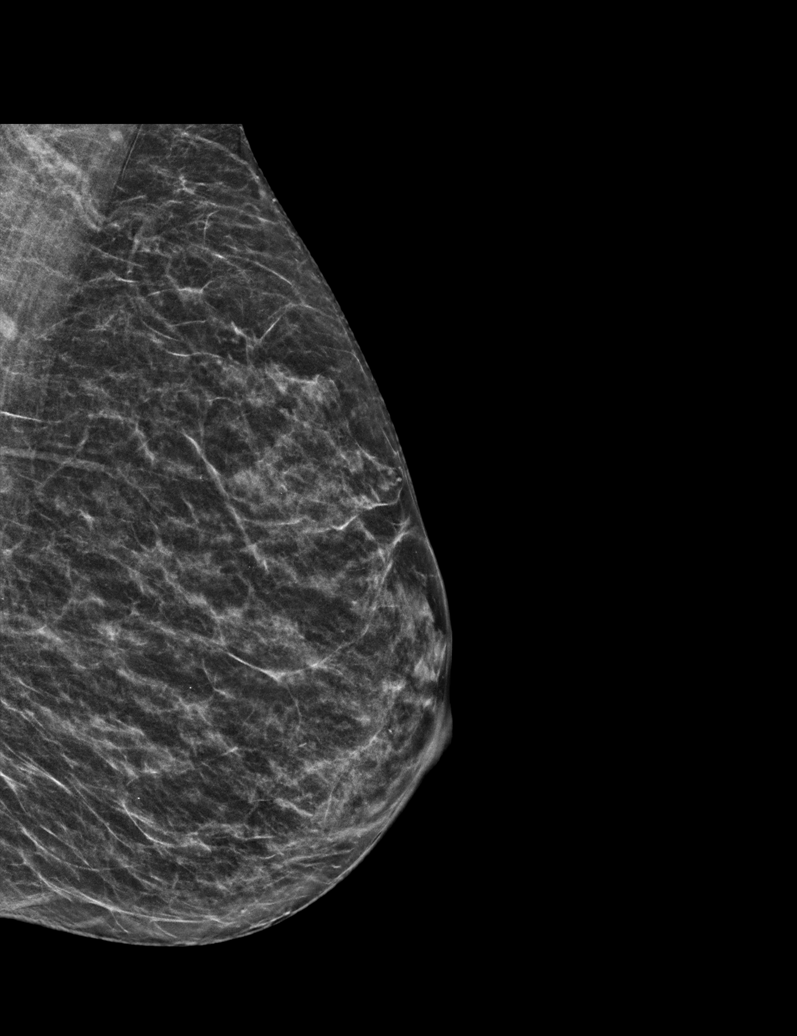

[R CC synth-2D]
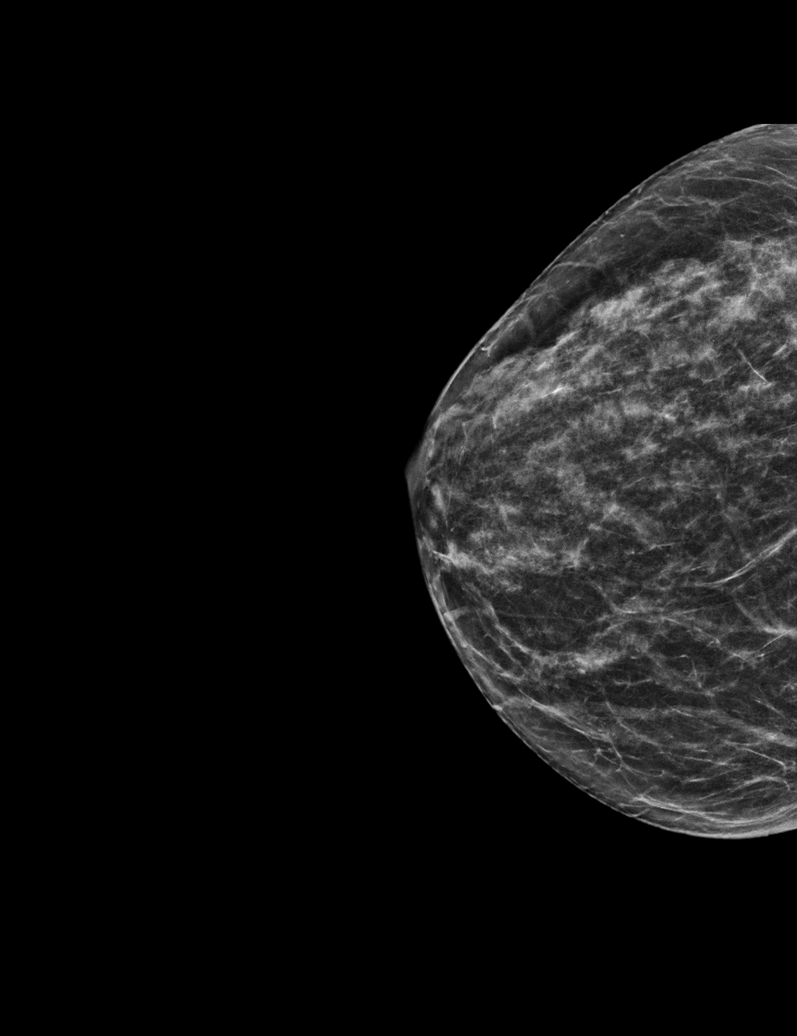

[L MLO tomo · tomo slice 24/47.0]
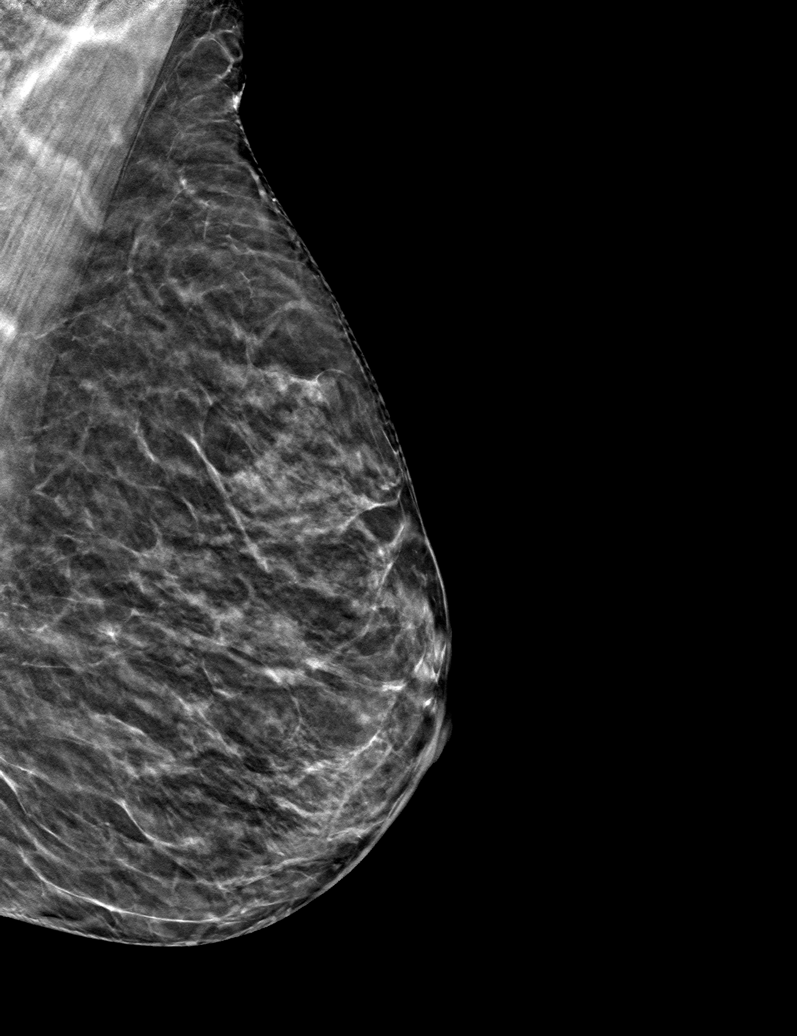

[6 of 30 positions shown; findings below may reference images not displayed]

ACR Breast Density Category c: The breast tissue is heterogeneously
dense, which may obscure small masses.
FINDINGS: There are no findings suspicious for malignancy.
IMPRESSION: No mammographic evidence of malignancy. A result letter of this
screening mammogram will be mailed directly to the patient.

RECOMMENDATION:
Screening mammogram in one year. (Code:Q3-W-BC3)

BI-RADS CATEGORY  1: Negative.

## 2024-02-06 ENCOUNTER — Inpatient Hospital Stay: Admission: RE | Admit: 2024-02-06 | Payer: Medicare Other | Source: Ambulatory Visit

## 2024-05-28 ENCOUNTER — Other Ambulatory Visit (HOSPITAL_BASED_OUTPATIENT_CLINIC_OR_DEPARTMENT_OTHER): Payer: Self-pay | Admitting: Nurse Practitioner

## 2024-05-28 DIAGNOSIS — M8580 Other specified disorders of bone density and structure, unspecified site: Secondary | ICD-10-CM

## 2024-06-02 ENCOUNTER — Ambulatory Visit (HOSPITAL_BASED_OUTPATIENT_CLINIC_OR_DEPARTMENT_OTHER)

## 2024-06-12 ENCOUNTER — Other Ambulatory Visit: Payer: Self-pay

## 2024-06-12 ENCOUNTER — Encounter (HOSPITAL_COMMUNITY): Payer: Self-pay

## 2024-06-12 ENCOUNTER — Emergency Department (HOSPITAL_COMMUNITY)

## 2024-06-12 ENCOUNTER — Emergency Department (HOSPITAL_COMMUNITY)
Admission: EM | Admit: 2024-06-12 | Discharge: 2024-06-12 | Disposition: A | Discharge status: Home / Self Care | Attending: Emergency Medicine | Admitting: Emergency Medicine

## 2024-06-12 DIAGNOSIS — R053 Chronic cough: Secondary | ICD-10-CM | POA: Diagnosis not present

## 2024-06-12 DIAGNOSIS — R911 Solitary pulmonary nodule: Secondary | ICD-10-CM | POA: Insufficient documentation

## 2024-06-12 DIAGNOSIS — R0789 Other chest pain: Secondary | ICD-10-CM | POA: Insufficient documentation

## 2024-06-12 LAB — I-STAT CHEM 8, ED
BUN: 9 mg/dL (ref 8–23)
Calcium, Ion: 1.18 mmol/L (ref 1.15–1.40)
Chloride: 98 mmol/L (ref 98–111)
Creatinine, Ser: 0.6 mg/dL (ref 0.44–1.00)
Glucose, Bld: 97 mg/dL (ref 70–99)
HCT: 40 % (ref 36.0–46.0)
Hemoglobin: 13.6 g/dL (ref 12.0–15.0)
Potassium: 4.1 mmol/L (ref 3.5–5.1)
Sodium: 133 mmol/L — ABNORMAL LOW (ref 135–145)
TCO2: 25 mmol/L (ref 22–32)

## 2024-06-12 LAB — BASIC METABOLIC PANEL WITH GFR
Anion gap: 10 (ref 5–15)
BUN: 9 mg/dL (ref 8–23)
CO2: 23 mmol/L (ref 22–32)
Calcium: 9.2 mg/dL (ref 8.9–10.3)
Chloride: 99 mmol/L (ref 98–111)
Creatinine, Ser: 0.51 mg/dL (ref 0.44–1.00)
GFR, Estimated: >60 mL/min (ref >60)
Glucose, Bld: 100 mg/dL — ABNORMAL HIGH (ref 70–99)
Potassium: 4.2 mmol/L (ref 3.5–5.1)
Sodium: 132 mmol/L — ABNORMAL LOW (ref 135–145)

## 2024-06-12 LAB — CBC
HCT: 36.8 % (ref 36.0–46.0)
Hemoglobin: 12.7 g/dL (ref 12.0–15.0)
MCH: 32.6 pg (ref 26.0–34.0)
MCHC: 34.5 g/dL (ref 30.0–36.0)
MCV: 94.4 fL (ref 80.0–100.0)
Platelets: 225 K/uL (ref 150–400)
RBC: 3.9 MIL/uL (ref 3.87–5.11)
RDW: 12.1 % (ref 11.5–15.5)
WBC: 3.9 K/uL — ABNORMAL LOW (ref 4.0–10.5)
nRBC: 0 % (ref 0.0–0.2)

## 2024-06-12 LAB — TROPONIN I (HIGH SENSITIVITY)
Troponin I (High Sensitivity): 3 ng/L (ref <18)
Troponin I (High Sensitivity): 3 ng/L (ref <18)

## 2024-06-12 MED ORDER — IOHEXOL 350 MG/ML SOLN
75.0000 mL | Freq: Once | INTRAVENOUS | Status: AC | PRN
  Administered 2024-06-12: 75 mL via INTRAVENOUS

## 2024-06-12 NOTE — ED Triage Notes (Signed)
 Pt came in via POV d/t generalized Lt side chest pressure that started 8 weeks ago. Also endorses since then her dizziness has gradually worsened & she reports that I cough a lot. States that her cardiologist called her for an abnormally high dimer after labs resulted & recommended she come into ED for a CT. A/Ox4, rates her 5/10 during triage.

## 2024-06-12 NOTE — Discharge Instructions (Signed)
 You were seen for your chest pain in the emergency department.   At home, please take tylenol  and over the counter lidocaine  patches for it.    Check your MyChart online for the results of any tests that had not resulted by the time you left the emergency department.   Follow-up with your primary doctor in 2-3 days regarding your visit.  Talk to them about a repeat CT scan for your lung nodule  Return immediately to the emergency department if you experience any of the following: worsening pain, difficulty breathing, or any other concerning symptoms.    Thank you for visiting our Emergency Department. It was a pleasure taking care of you today.

## 2024-06-12 NOTE — ED Provider Notes (Signed)
 [place] [facility] AT [facility] Provider Note   CSN: [id number] Arrival date & time: 06/12/24  9143     Patient presents with: Chest Pressure and Dizziness   Janet Houston is a 73 y.o. female.   73 year old female with a history of atypical chest pain, chronic cough, and thoracic aortic aneurysm who presents to the emergency department chest pain.  Patient reports over the past 3 weeks she has had gradually worsening chest pain.  On the left side underneath her left breast.  Dull.  4/10 in severity currently.  Pleuritic and positional but not exertional.  No shortness of breath.  Has a chronic cough.  No lower extremity swelling.  No diaphoresis or vomiting.  Saw her cardiologist yesterday who sent a D-dimer that was 1.31.  Told her that he did not believe the chest pain was cardiac.  Recommended that she come into the emergency department for CTA.  No hormones.  No recent swelling of her legs.  No history of DVT or PE.  No history of cancer.  No recent surgery or travel.       Prior to Admission medications   Medication Sig Start Date End Date Taking? Authorizing Provider  B Complex-C (B-COMPLEX WITH VITAMIN C ) tablet Take by mouth.    [provider]  Cholecalciferol 25 MCG (1000 UT) capsule Take 2,000 Units by mouth daily.    [provider]  Multiple Vitamin (MULTIVITAMIN WITH MINERALS) TABS Take 1 tablet by mouth daily.    [provider]  pregabalin (LYRICA) 100 MG capsule Take 100 mg by mouth at bedtime.    [provider]    Allergies: Bee venom, Coconut fatty acid, Codeine, Penicillins, Strawberry extract, Cocoa, Chocolate, and Other    Review of Systems  Updated Vital Signs BP 113/70   Pulse 70   Temp 98.1 F (36.7 C) (Oral)   Resp 14   Ht 5' 2 (1.575 m)   Wt 49.9 kg   SpO2 100%   BMI 20.12 kg/m   Physical Exam Vitals and nursing note reviewed.  Constitutional:      General: She is not in acute  distress.    Appearance: She is well-developed.  HENT:     Head: Normocephalic and atraumatic.     Right Ear: External ear normal.     Left Ear: External ear normal.     Nose: Nose normal.  Eyes:     Extraocular Movements: Extraocular movements intact.     Conjunctiva/sclera: Conjunctivae normal.     Pupils: Pupils are equal, round, and reactive to light.  Cardiovascular:     Rate and Rhythm: Normal rate and regular rhythm.     Heart sounds: No murmur heard.    Comments: No rash on chest wall where she is having the pain.  Chest pain reproducible. Pulmonary:     Effort: Pulmonary effort is normal. No respiratory distress.     Breath sounds: Normal breath sounds.  Musculoskeletal:     Cervical back: Normal range of motion and neck supple.     Right lower leg: No edema.     Left lower leg: No edema.  Skin:    General: Skin is warm and dry.  Neurological:     Mental Status: She is alert and oriented to person, place, and time. Mental status is at baseline.  Psychiatric:        Mood and Affect: Mood normal.     (all labs ordered are  listed, but only abnormal results are displayed) Labs Reviewed  BASIC METABOLIC PANEL WITH GFR - Abnormal; Notable for the following components:      Result Value   Sodium 132 (*)    Glucose, Bld 100 (*)    All other components within normal limits  CBC - Abnormal; Notable for the following components:   WBC 3.9 (*)    All other components within normal limits  I-STAT CHEM 8, ED - Abnormal; Notable for the following components:   Sodium 133 (*)    All other components within normal limits  TROPONIN I (HIGH SENSITIVITY)  TROPONIN I (HIGH SENSITIVITY)    EKG: EKG Interpretation Date/Time:  Saturday June 12 2024 09:16:04 EST Ventricular Rate:  78 PR Interval:  162 QRS Duration:  118 QT Interval:  392 QTC Calculation: 446 R Axis:   74  Text Interpretation: Normal sinus rhythm Right atrial enlargement Right bundle branch block Abnormal  ECG No previous ECGs available Confirmed by Yolande Charleston [phone]) on 06/12/2024 9:37:38 AM  Radiology: CT Angio Chest PE W and/or Wo Contrast Result Date: 06/12/2024 CLINICAL DATA:  Chest pain left-sided beginning 8 weeks ago. Dizziness. Evaluate for pulmonary emboli. Cough. EXAM: CT ANGIOGRAPHY CHEST WITH CONTRAST TECHNIQUE: Multidetector CT imaging of the chest was performed using the standard protocol during bolus administration of intravenous contrast. Multiplanar CT image reconstructions and MIPs were obtained to evaluate the vascular anatomy. RADIATION DOSE REDUCTION: This exam was performed according to the departmental dose-optimization program which includes automated exposure control, adjustment of the mA and/or kV according to patient size and/or use of iterative reconstruction technique. CONTRAST:  75mL OMNIPAQUE IOHEXOL 350 MG/ML SOLN COMPARISON:  Chest x-ray today. FINDINGS: Cardiovascular: Heart is normal in size. Mild prominence of the thoracic aorta without aneurysm. Pulmonary arterial system is well opacified without evidence of emboli. Remaining vascular structures are unremarkable. Mediastinum/Nodes: No significant mediastinal or hilar adenopathy. Remaining mediastinal structures are unremarkable. Lungs/Pleura: Lungs are adequately inflated without acute airspace consolidation or effusion. Airways are normal. 4 mm nodular density along the left major fissure. Upper Abdomen: Visualized images through the upper abdomen demonstrate minimal calcified plaque over the abdominal aorta. No acute findings. Musculoskeletal: No focal abnormality. Review of the MIP images confirms the above findings. IMPRESSION: 1. No acute cardiopulmonary disease and no evidence of pulmonary emboli. 2. 4 mm nodular density along the left major fissure. No follow-up needed if patient is low-risk.This recommendation follows the consensus statement: Guidelines for Management of Incidental Pulmonary Nodules Detected on  CT Images: From the Fleischner Society 2017; Radiology 2017; 284:228-243. 3. Aortic atherosclerosis. Aortic Atherosclerosis (ICD10-I70.0). Electronically Signed   By: Toribio Agreste M.D.   On: 06/12/2024 12:53   DG Chest 2 View Result Date: 06/12/2024 CLINICAL DATA:  Left-sided chest pain and dizziness 8 weeks. EXAM: CHEST - 2 VIEW COMPARISON:  None Available. FINDINGS: Lungs are hyperexpanded without focal airspace consolidation or effusion. Cardiomediastinal silhouette is normal. Fixation plate and screws over the proximal left humerus. Minimal degenerative change of the spine. IMPRESSION: No acute cardiopulmonary disease. Electronically Signed   By: Toribio Agreste M.D.   On: 06/12/2024 10:01     Procedures   Medications Ordered in the ED  iohexol (OMNIPAQUE) 350 MG/ML injection 75 mL (75 mLs Intravenous Contrast Given 06/12/24 1244)                                    Medical  Decision Making Amount and/or Complexity of Data Reviewed Labs: ordered. Radiology: ordered.  Risk Prescription drug management.   Janet Houston is a 73 year old female with a history of atypical chest pain, chronic cough, and thoracic aortic aneurysm who presents to the emergency department chest pain.   Initial Ddx:  Chest wall pain, MI, PE, pneumonia, dissection, pericarditis, costochondritis, reflux  MDM:  Pt presents with 3 weeks of R sided pleuritic and positional chest pain. Does have chronic cough.  Pain is reproducible.  There is no no overlying rash on exam.  With the patient's chest discomfort will obtain EKG and troponins to evaluate for MI but feel this is less likely given the lack of exertional component or other anginal symptoms.  Had D-dimer that was elevated as an outpatient will obtain a CTA.  Considered dissection but with their symmetric pulses, history, and description of the pain feel it is less likely.  Also considered pericarditis but description is unlikely and they do not have risk  factors for this diagnosis.  No infectious symptoms to suggest pneumonia at this time that would be causing pleuritic chest pain.  Plan:  Labs Troponin EKG CTA  ED Summary/Re-evaluation:  EKG showed sinus rhythm with right bundle branch block.  Serial troponins WNL x 2.  She had a CTA that does not show PE or pneumonia.  Does show a lung nodule.  Does have a history of smoking so was counseled to follow-up with her primary doctor to see when she needs a repeat CT scan.  I suspect that the pain is likely musculoskeletal in nature and likely result of her chronic cough.  Instructed to take Tylenol  and use lidocaine  patches for pain.  Already follows with cardiology who feels this is not cardiac  This patient presents to the ED for concern of complaints listed in HPI, this involves an extensive number of treatment options, and is a complaint that carries with it a high risk of complications and morbidity. Disposition including potential need for admission considered.   Dispo: DC Home. Return precautions discussed including, but not limited to, those listed in the AVS. Allowed pt time to ask questions which were answered fully prior to dc.  Additional history obtained from spouse Records reviewed Outpatient Clinic Notes The following labs were independently interpreted: Chemistry and show no acute abnormality I independently reviewed the following imaging with scope of interpretation limited to determining acute life threatening conditions related to emergency care: CT Chest and agree with the radiologist interpretation with the following exceptions: none I personally reviewed and interpreted cardiac monitoring: normal sinus rhythm  I personally reviewed and interpreted the pt's EKG: see above for interpretation  I have reviewed the patients home medications and made adjustments as needed Social Determinants of health:  Geriatric   Final diagnoses:  Chest wall pain  Lung nodule seen on  imaging study    ED Discharge Orders     None          Yolande Lamar BROCKS, MD 06/12/24 1324

## 2024-06-12 NOTE — ED Notes (Signed)
 Patient transported to CT

## 2024-07-12 ENCOUNTER — Encounter (HOSPITAL_BASED_OUTPATIENT_CLINIC_OR_DEPARTMENT_OTHER): Payer: Self-pay

## 2024-07-12 ENCOUNTER — Inpatient Hospital Stay (HOSPITAL_BASED_OUTPATIENT_CLINIC_OR_DEPARTMENT_OTHER)
Admission: RE | Admit: 2024-07-12 | Discharge: 2024-07-12 | Attending: Nurse Practitioner | Admitting: Nurse Practitioner

## 2024-07-12 ENCOUNTER — Inpatient Hospital Stay (HOSPITAL_BASED_OUTPATIENT_CLINIC_OR_DEPARTMENT_OTHER): Admission: RE | Admit: 2024-07-12 | Discharge: 2024-07-12 | Attending: Family Medicine | Admitting: Family Medicine

## 2024-07-12 DIAGNOSIS — Z1231 Encounter for screening mammogram for malignant neoplasm of breast: Secondary | ICD-10-CM | POA: Diagnosis present

## 2024-07-12 DIAGNOSIS — Z780 Asymptomatic menopausal state: Secondary | ICD-10-CM | POA: Diagnosis not present

## 2024-07-12 DIAGNOSIS — M8580 Other specified disorders of bone density and structure, unspecified site: Secondary | ICD-10-CM | POA: Insufficient documentation

## 2024-07-12 DIAGNOSIS — M8589 Other specified disorders of bone density and structure, multiple sites: Secondary | ICD-10-CM | POA: Diagnosis not present

## 2024-09-06 ENCOUNTER — Ambulatory Visit
